# Patient Record
Sex: Female | Born: 1955
Health system: Southern US, Community
[De-identification: ages and names within clinical notes are randomized; demographics above are authoritative.]

## PROBLEM LIST (undated history)

## (undated) DIAGNOSIS — J330 Polyp of nasal cavity: Secondary | ICD-10-CM

## (undated) DIAGNOSIS — IMO0002 Reserved for concepts with insufficient information to code with codable children: Secondary | ICD-10-CM

## (undated) DIAGNOSIS — A64 Unspecified sexually transmitted disease: Secondary | ICD-10-CM

## (undated) DIAGNOSIS — E785 Hyperlipidemia, unspecified: Secondary | ICD-10-CM

## (undated) DIAGNOSIS — I10 Essential (primary) hypertension: Secondary | ICD-10-CM

## (undated) DIAGNOSIS — J309 Allergic rhinitis, unspecified: Secondary | ICD-10-CM

## (undated) DIAGNOSIS — D649 Anemia, unspecified: Secondary | ICD-10-CM

## (undated) DIAGNOSIS — J45909 Unspecified asthma, uncomplicated: Secondary | ICD-10-CM

## (undated) DIAGNOSIS — E119 Type 2 diabetes mellitus without complications: Secondary | ICD-10-CM

## (undated) DIAGNOSIS — T7840XA Allergy, unspecified, initial encounter: Secondary | ICD-10-CM

## (undated) DIAGNOSIS — K219 Gastro-esophageal reflux disease without esophagitis: Secondary | ICD-10-CM

## (undated) DIAGNOSIS — D219 Benign neoplasm of connective and other soft tissue, unspecified: Secondary | ICD-10-CM

## (undated) DIAGNOSIS — C439 Malignant melanoma of skin, unspecified: Secondary | ICD-10-CM

## (undated) DIAGNOSIS — F419 Anxiety disorder, unspecified: Secondary | ICD-10-CM

## (undated) HISTORY — DX: Anemia, unspecified: D64.9

## (undated) HISTORY — DX: Essential (primary) hypertension: I10

## (undated) HISTORY — DX: Type 2 diabetes mellitus without complications: E11.9

## (undated) HISTORY — DX: Allergic rhinitis, unspecified: J30.9

## (undated) HISTORY — DX: Allergy, unspecified, initial encounter: T78.40XA

## (undated) HISTORY — DX: Benign neoplasm of connective and other soft tissue, unspecified: D21.9

## (undated) HISTORY — PX: LAPAROSCOPIC OVARIAN CYSTECTOMY: SUR786

## (undated) HISTORY — PX: UPPER GASTROINTESTINAL ENDOSCOPY: SHX188

## (undated) HISTORY — DX: Unspecified sexually transmitted disease: A64

## (undated) HISTORY — PX: ROTATOR CUFF REPAIR: SHX139

## (undated) HISTORY — PX: MELANOMA EXCISION WITH SENTINEL LYMPH NODE BIOPSY: SHX5267

## (undated) HISTORY — PX: COLONOSCOPY: SHX174

## (undated) HISTORY — DX: Malignant melanoma of skin, unspecified: C43.9

## (undated) HISTORY — PX: TONSILLECTOMY: SUR1361

## (undated) HISTORY — DX: Reserved for concepts with insufficient information to code with codable children: IMO0002

## (undated) HISTORY — DX: Unspecified asthma, uncomplicated: J45.909

## (undated) HISTORY — DX: Polyp of nasal cavity: J33.0

## (undated) HISTORY — DX: Gastro-esophageal reflux disease without esophagitis: K21.9

## (undated) HISTORY — DX: Anxiety disorder, unspecified: F41.9

## (undated) HISTORY — DX: Hyperlipidemia, unspecified: E78.5

---

## 1988-02-03 DIAGNOSIS — IMO0002 Reserved for concepts with insufficient information to code with codable children: Secondary | ICD-10-CM

## 1988-02-03 DIAGNOSIS — R87619 Unspecified abnormal cytological findings in specimens from cervix uteri: Secondary | ICD-10-CM

## 1988-02-03 HISTORY — DX: Unspecified abnormal cytological findings in specimens from cervix uteri: R87.619

## 1988-02-03 HISTORY — PX: CRYOTHERAPY: SHX1416

## 1988-02-03 HISTORY — DX: Reserved for concepts with insufficient information to code with codable children: IMO0002

## 1998-11-05 ENCOUNTER — Encounter: Payer: Self-pay | Admitting: *Deleted

## 1998-11-05 ENCOUNTER — Ambulatory Visit (HOSPITAL_COMMUNITY): Admission: RE | Admit: 1998-11-05 | Discharge: 1998-11-05 | Payer: Self-pay | Admitting: *Deleted

## 1999-03-31 ENCOUNTER — Other Ambulatory Visit: Admission: RE | Admit: 1999-03-31 | Discharge: 1999-03-31 | Payer: Self-pay | Admitting: *Deleted

## 2000-02-03 HISTORY — PX: SUPRACERVICAL ABDOMINAL HYSTERECTOMY: SHX5393

## 2000-03-29 ENCOUNTER — Other Ambulatory Visit: Admission: RE | Admit: 2000-03-29 | Discharge: 2000-03-29 | Payer: Self-pay | Admitting: *Deleted

## 2001-01-13 ENCOUNTER — Encounter (INDEPENDENT_AMBULATORY_CARE_PROVIDER_SITE_OTHER): Payer: Self-pay | Admitting: Specialist

## 2001-01-13 ENCOUNTER — Observation Stay (HOSPITAL_COMMUNITY): Admission: RE | Admit: 2001-01-13 | Discharge: 2001-01-14 | Payer: Self-pay | Admitting: *Deleted

## 2001-04-28 ENCOUNTER — Encounter: Payer: Self-pay | Admitting: *Deleted

## 2001-04-28 ENCOUNTER — Ambulatory Visit (HOSPITAL_COMMUNITY): Admission: RE | Admit: 2001-04-28 | Discharge: 2001-04-28 | Payer: Self-pay | Admitting: *Deleted

## 2001-05-25 ENCOUNTER — Encounter: Admission: RE | Admit: 2001-05-25 | Discharge: 2001-05-25 | Payer: Self-pay | Admitting: Internal Medicine

## 2001-05-25 ENCOUNTER — Encounter: Payer: Self-pay | Admitting: Internal Medicine

## 2001-06-28 ENCOUNTER — Other Ambulatory Visit: Admission: RE | Admit: 2001-06-28 | Discharge: 2001-06-28 | Payer: Self-pay | Admitting: *Deleted

## 2002-04-19 ENCOUNTER — Emergency Department (HOSPITAL_COMMUNITY): Admission: EM | Admit: 2002-04-19 | Discharge: 2002-04-19 | Payer: Self-pay

## 2002-07-11 ENCOUNTER — Other Ambulatory Visit: Admission: RE | Admit: 2002-07-11 | Discharge: 2002-07-11 | Payer: Self-pay | Admitting: *Deleted

## 2002-12-01 ENCOUNTER — Encounter (INDEPENDENT_AMBULATORY_CARE_PROVIDER_SITE_OTHER): Payer: Self-pay | Admitting: Specialist

## 2002-12-01 ENCOUNTER — Ambulatory Visit (HOSPITAL_COMMUNITY): Admission: RE | Admit: 2002-12-01 | Discharge: 2002-12-01 | Payer: Self-pay | Admitting: *Deleted

## 2003-01-31 ENCOUNTER — Emergency Department (HOSPITAL_COMMUNITY): Admission: EM | Admit: 2003-01-31 | Discharge: 2003-01-31 | Payer: Self-pay | Admitting: Emergency Medicine

## 2003-02-03 ENCOUNTER — Emergency Department (HOSPITAL_COMMUNITY): Admission: EM | Admit: 2003-02-03 | Discharge: 2003-02-03 | Payer: Self-pay | Admitting: Emergency Medicine

## 2003-02-05 ENCOUNTER — Ambulatory Visit (HOSPITAL_COMMUNITY): Admission: RE | Admit: 2003-02-05 | Discharge: 2003-02-05 | Payer: Self-pay | Admitting: *Deleted

## 2003-07-31 ENCOUNTER — Other Ambulatory Visit: Admission: RE | Admit: 2003-07-31 | Discharge: 2003-07-31 | Payer: Self-pay | Admitting: *Deleted

## 2003-09-26 ENCOUNTER — Ambulatory Visit (HOSPITAL_COMMUNITY): Admission: RE | Admit: 2003-09-26 | Discharge: 2003-09-26 | Payer: Self-pay | Admitting: Internal Medicine

## 2003-12-15 ENCOUNTER — Encounter: Admission: RE | Admit: 2003-12-15 | Discharge: 2003-12-15 | Payer: Self-pay | Admitting: Internal Medicine

## 2004-02-03 HISTORY — PX: ROTATOR CUFF REPAIR: SHX139

## 2004-04-17 ENCOUNTER — Ambulatory Visit: Payer: Self-pay | Admitting: Internal Medicine

## 2004-04-29 ENCOUNTER — Ambulatory Visit (HOSPITAL_COMMUNITY): Admission: RE | Admit: 2004-04-29 | Discharge: 2004-04-29 | Payer: Self-pay | Admitting: *Deleted

## 2004-08-14 ENCOUNTER — Emergency Department (HOSPITAL_COMMUNITY): Admission: EM | Admit: 2004-08-14 | Discharge: 2004-08-14 | Payer: Self-pay

## 2004-09-04 ENCOUNTER — Other Ambulatory Visit: Admission: RE | Admit: 2004-09-04 | Discharge: 2004-09-04 | Payer: Self-pay | Admitting: *Deleted

## 2004-11-14 ENCOUNTER — Ambulatory Visit: Payer: Self-pay | Admitting: Internal Medicine

## 2005-04-30 ENCOUNTER — Ambulatory Visit: Payer: Self-pay | Admitting: Internal Medicine

## 2005-05-29 ENCOUNTER — Ambulatory Visit (HOSPITAL_COMMUNITY): Admission: RE | Admit: 2005-05-29 | Discharge: 2005-05-29 | Payer: Self-pay | Admitting: *Deleted

## 2005-08-11 ENCOUNTER — Ambulatory Visit (HOSPITAL_BASED_OUTPATIENT_CLINIC_OR_DEPARTMENT_OTHER): Admission: RE | Admit: 2005-08-11 | Discharge: 2005-08-11 | Payer: Self-pay | Admitting: Orthopaedic Surgery

## 2005-08-21 ENCOUNTER — Encounter: Admission: RE | Admit: 2005-08-21 | Discharge: 2005-08-21 | Payer: Self-pay | Admitting: Internal Medicine

## 2005-09-22 ENCOUNTER — Ambulatory Visit (HOSPITAL_COMMUNITY): Admission: RE | Admit: 2005-09-22 | Discharge: 2005-09-22 | Payer: Self-pay | Admitting: *Deleted

## 2005-10-15 ENCOUNTER — Other Ambulatory Visit: Admission: RE | Admit: 2005-10-15 | Discharge: 2005-10-15 | Payer: Self-pay | Admitting: *Deleted

## 2006-04-30 ENCOUNTER — Ambulatory Visit: Payer: Self-pay | Admitting: Internal Medicine

## 2006-11-11 ENCOUNTER — Ambulatory Visit: Payer: Self-pay | Admitting: Internal Medicine

## 2006-11-30 ENCOUNTER — Other Ambulatory Visit: Admission: RE | Admit: 2006-11-30 | Discharge: 2006-11-30 | Payer: Self-pay | Admitting: *Deleted

## 2007-04-27 DIAGNOSIS — J4520 Mild intermittent asthma, uncomplicated: Secondary | ICD-10-CM | POA: Insufficient documentation

## 2007-04-27 DIAGNOSIS — J3089 Other allergic rhinitis: Secondary | ICD-10-CM | POA: Insufficient documentation

## 2007-04-27 DIAGNOSIS — J302 Other seasonal allergic rhinitis: Secondary | ICD-10-CM | POA: Insufficient documentation

## 2007-04-27 DIAGNOSIS — J330 Polyp of nasal cavity: Secondary | ICD-10-CM | POA: Insufficient documentation

## 2007-04-28 ENCOUNTER — Ambulatory Visit: Payer: Self-pay | Admitting: Internal Medicine

## 2007-06-03 ENCOUNTER — Ambulatory Visit: Payer: Self-pay | Admitting: Internal Medicine

## 2007-11-15 ENCOUNTER — Other Ambulatory Visit: Payer: Self-pay | Admitting: Emergency Medicine

## 2007-11-15 ENCOUNTER — Inpatient Hospital Stay (HOSPITAL_COMMUNITY): Admission: AD | Admit: 2007-11-15 | Discharge: 2007-11-21 | Payer: Self-pay | Admitting: Internal Medicine

## 2007-12-06 ENCOUNTER — Other Ambulatory Visit: Admission: RE | Admit: 2007-12-06 | Discharge: 2007-12-06 | Payer: Self-pay | Admitting: Obstetrics & Gynecology

## 2008-01-04 ENCOUNTER — Ambulatory Visit (HOSPITAL_COMMUNITY): Admission: RE | Admit: 2008-01-04 | Discharge: 2008-01-04 | Payer: Self-pay | Admitting: Obstetrics and Gynecology

## 2008-04-27 ENCOUNTER — Ambulatory Visit: Payer: Self-pay | Admitting: Internal Medicine

## 2008-04-27 DIAGNOSIS — E119 Type 2 diabetes mellitus without complications: Secondary | ICD-10-CM | POA: Insufficient documentation

## 2009-04-23 ENCOUNTER — Ambulatory Visit (HOSPITAL_COMMUNITY): Admission: RE | Admit: 2009-04-23 | Discharge: 2009-04-23 | Payer: Self-pay | Admitting: Obstetrics and Gynecology

## 2009-04-26 ENCOUNTER — Ambulatory Visit: Payer: Self-pay | Admitting: Internal Medicine

## 2010-03-04 NOTE — Assessment & Plan Note (Signed)
Summary: 12 MTH FU-STC   Visit Type:  Follow-up  Chief Complaint:  12 month follow-up visit.  History of Present Illness: Current Problems:  Hx of NASAL POLYP (ICD-471.0) ALLERGIC RHINITIS (ICD-477.9) ASTHMA (ICD-493.90)  One year f/u. Has done fairly well. Lacrimation last year resolved. Albuterol makes her too tachy and is avoided. She has only needed her replacement atrovent rescue inhaler twice. We reviewed her meds.       Current Allergies: ! PENICILLIN ! CIPRO ! ZITHROMAX   Family History:    Reviewed history and no changes required:       Sister Rogue Bussing with severe chronic asthma  Social History:    Reviewed history and no changes required:       Patient never smoked.    Risk Factors:  Tobacco use:  never   Review of Systems      See HPI   Vital Signs:  Patient Profile:   55 Years Old Female Weight:      162.38 pounds O2 Sat:      100 % O2 treatment:    Room Air Pulse rate:   79 / minute BP sitting:   98 / 70  (left arm)  Vitals Entered By: Cloyde Reams RN (April 28, 2007 2:33 PM)             Comments Pt here for 12 month follow-up visit.  Breathing well overall. C/O normal allergy symptoms for this time of year with eyes. Medications reviewed ..................................................................Marland KitchenCloyde Reams RN  April 28, 2007 2:39 PM      Physical Exam  General:     normal appearance and healthy appearing.   Head:     normocephalic and atraumatic Eyes:     PERRLA/EOM intact; conjunctiva and sclera clear. contacts Ears:     TMs intact and clear with normal canals Nose:     no deformity, discharge, inflammation, or lesions Mouth:     no deformity or lesions Neck:     no JVD.   Lungs:     clear bilaterally to auscultation and percussion Heart:     regular rate and rhythm, S1, S2 without murmurs, rubs, gallops, or clicks     Problem # 1:  ALLERGIC RHINITIS (ICD-477.9) Assessment: Improved  Her updated  medication list for this problem includes:    Flonase 50 Mcg/act Susp (Fluticasone propionate) .Marland Kitchen... Take 2 sprays in each nostril once a day    Allegra 180 Mg Tabs (Fexofenadine hcl) .Marland Kitchen... Take 1 tablet by mouth once a day  Orders: Est. Patient Level III (16109)   Problem # 2:  ASTHMA (ICD-493.90) Assessment: Improved  The following medications were removed from the medication list:    Atrovent Hfa 17 Mcg/act Aers (Ipratropium bromide hfa) ..... Inhale 2 puffs every 4 hours as needed  Her updated medication list for this problem includes:    Singulair 10 Mg Tabs (Montelukast sodium) .Marland Kitchen... Take 1 tablet by mouth once a day    Atrovent Hfa 17 Mcg/act Aers (Ipratropium bromide hfa) ..... Inhale 2 puffs every 4 hours as needed  Orders: Est. Patient Level III (60454)   Medications Added to Medication List This Visit: 1)  Flonase 50 Mcg/act Susp (Fluticasone propionate) .... Take 2 sprays in each nostril once a day 2)  Amerge 2.5 Mg Tabs (Naratriptan hcl) .... As needed 3)  Atrovent Hfa 17 Mcg/act Aers (Ipratropium bromide hfa) .... Inhale 2 puffs every 4 hours as needed 4)  Patanol 0.1 % Soln (Olopatadine hcl) .Marland KitchenMarland KitchenMarland Kitchen  As needed 5)  Fosamax 70 Mg Tabs (Alendronate sodium) .... Take 1 tablet once weekly 6)  Calcium For Women 500-100-40 Chew (Calcium-vitamin d-vitamin k) .... Take 1 chew by mouth two times a day   Patient Instructions: 1)  Please schedule a follow-up appointment in 1 year. 2)  Meds refilled. Call for atrovent refill as needed.    Prescriptions: ALLEGRA 180 MG  TABS (FEXOFENADINE HCL) Take 1 tablet by mouth once a day  #90 x 3   Entered and Authorized by:   Waymon Budge MD   Signed by:   Waymon Budge MD on 04/28/2007   Method used:   Print then Give to Patient   RxID:   [id number] FLONASE 50 MCG/ACT  SUSP (FLUTICASONE PROPIONATE) Take 2 sprays in each nostril once a day  #3 x 3   Entered and Authorized by:   Waymon Budge MD   Signed by:   Waymon Budge MD on 04/28/2007   Method used:   Print then Give to Patient   RxID:   [id number] SINGULAIR 10 MG  TABS (MONTELUKAST SODIUM) Take 1 tablet by mouth once a day  #90 x 3   Entered and Authorized by:   Waymon Budge MD   Signed by:   Waymon Budge MD on 04/28/2007   Method used:   Print then Give to Patient   RxID:   [id number]  ]

## 2010-03-04 NOTE — Assessment & Plan Note (Signed)
Summary: 1 year / mbw   Primary Provider/Referring Provider:  Merri Brunette  CC:  Yearly Follow up visit.  History of Present Illness:  06/03/07- Allergic rhinitis, asthma, hx nasal polyps. She reports that she has noticed wheezing, if she is quiet, especially at night over the past week, and worse in the past two days.  She woke feeling hoarse this morning.  Otherwise, she has had no head congestion.  She does not feel acutely ill, and she denies purulent discharge, fever, headache,.  04/27/08- Allergic rhinitis, asthma, hx nasal polyps Hosp this winter and dx'd as diabetic. She is noting minimal allergy/ rhinitis so far this Spring. She feels these are well controlled with current meds. We discussed needs and pollen precautions. Denies wheeze, cough or phlegm.  April 26, 2009- Allergic rhinitis, ashtma, hx nasal polyps Uneventful year. After working outdoors last weekend she has had more nasal drainage. Coughs up some dark yellow. Taking allegra and flonase. Singulair has gotten too expensive.  No fever or wheeze.  Current Medications (verified): 1)  Singulair 10 Mg  Tabs (Montelukast Sodium) .... Take 1 Tablet By Mouth Once A Day 2)  Flonase 50 Mcg/act  Susp (Fluticasone Propionate) .... Take 2 Sprays in Each Nostril Once A Day 3)  Omeprazole 20 Mg Cpdr (Omeprazole) .... Take 1 By Mouth Once Daily 4)  Allegra 180 Mg  Tabs (Fexofenadine Hcl) .... Take 1 Tablet By Mouth Once A Day 5)  Multivitamins   Tabs (Multiple Vitamin) .... Take 1 Tablet By Mouth Once A Day 6)  Estroven   Tabs (Nutritional Supplements) .... Take 1 Tablet By Mouth Once A Day 7)  Atrovent Hfa 17 Mcg/act  Aers (Ipratropium Bromide Hfa) .... Inhale 2 Puffs Every 4 Hours As Needed 8)  Calcium Carbonate-Vitamin D 600-400 Mg-Unit  Tabs (Calcium Carbonate-Vitamin D) .... Take 1 Tablet By Mouth Once A Day 9)  Gnp Magnesium 250 Mg  Tabs (Magnesium) .... Take 1 Tablet By Mouth Once A Day 10)  Patanol 0.1 %  Soln (Olopatadine  Hcl) .... As Needed 11)  Fosamax 70 Mg  Tabs (Alendronate Sodium) .... Take 1 Tablet Once Weekly 12)  Calcium For Women 500-100-40  Chew (Calcium-Vitamin D-Vitamin K) .... Take 1 Chew By Mouth Two Times A Day 13)  Lisinopril 10 Mg Tabs (Lisinopril) .... Take 1 By Mouth Once Daily 14)  Metformin Hcl 1000 Mg Tabs (Metformin Hcl) .... Take 1 By Mouth Two Times A Day 15)  Glimepiride 4 Mg Tabs (Glimepiride) .... Take 1 By Mouth Two Times A Day 16)  Imitrex 20 Mg/act Soln (Sumatriptan) .... As Needed As Directed 17)  Simvastatin 20 Mg Tabs (Simvastatin) .... Take 1 By Mouth Once Daily 18)  Lantus 100 Unit/ml Soln (Insulin Glargine) .Marland Kitchen.. 14 Units Once Daily  Allergies (verified): 1)  ! Penicillin 2)  ! Cipro 3)  ! Zithromax  Past History:  Past Medical History: Last updated: 06/03/2007 Hx of NASAL POLYP (ICD-471.0) ALLERGIC RHINITIS (ICD-477.9) ASTHMA (ICD-493.90)  Past Surgical History: Last updated: 04/27/2008 Tonsillectomy Total Abdominal Hysterectomy Ovarian cystectomy Rotator cuff  Family History: Last updated: 04/28/2007 Sister Rogue Bussing with severe chronic asthma  Social History: Last updated: 06/03/2007 Patient never smoked. works at [employer]   Risk Factors: Smoking Status: never (04/28/2007)  Review of Systems      See HPI  The patient denies anorexia, fever, weight loss, weight gain, vision loss, decreased hearing, hoarseness, chest pain, syncope, dyspnea on exertion, peripheral edema, prolonged cough, headaches, hemoptysis, and severe  indigestion/heartburn.    Vital Signs:  Patient profile:   55 year old female Height:      63 inches Weight:      152.13 pounds BMI:     27.05 O2 Sat:      99 % on Room air Pulse rate:   108 / minute BP sitting:   104 / 74  (left arm) Cuff size:   regular  Vitals Entered By: Reynaldo Minium CMA (April 26, 2009 2:12 PM)  O2 Flow:  Room air  Physical Exam  Additional Exam:  General: A/Ox3; pleasant and  cooperative, NAD, wdwn SKIN: no rash, lesions NODES: no lymphadenopathy HEENT: [place]/AT, EOM- WNL, Conjuctivae- clear, PERRLA, TM-WNL, Nose- clear, Throat- clear and wnl, Mallampati  III NECK: Supple w/ fair ROM, JVD- none, normal carotid impulses w/o bruits Thyroid- normal to palpation CHEST: Clear to P&A HEART: RRR, no m/g/r heard ABDOMEN: Soft and nl; nml bowel sounds; no organomegaly or masses noted Extremities- No cyanosis, clubbing or edema Neuro- grossly WNL to observation      Impression & Recommendations:  Problem # 1:  ALLERGIC RHINITIS (ICD-477.9)  Seasonal exacerbation. I don't think she needs an antibiotic now but we will watch this. Consider Neti Pot. Her updated medication list for this problem includes:    Flonase 50 Mcg/act Susp (Fluticasone propionate) .Marland Kitchen... Take 2 sprays in each nostril once a day    Allegra 180 Mg Tabs (Fexofenadine hcl) .Marland Kitchen... Take 1 tablet by mouth once a day    Astepro 0.15 % Soln (Azelastine hcl) .Marland Kitchen... 1-2 sprays eaqh nostril up to twice daily as needed  Problem # 2:  ASTHMA (ICD-493.90) She uses Atrovent rescue inhaler only occasionally.  Medications Added to Medication List This Visit: 1)  Omeprazole 20 Mg Cpdr (Omeprazole) .... Take 1 by mouth once daily 2)  Metformin Hcl 1000 Mg Tabs (Metformin hcl) .... Take 1 by mouth two times a day 3)  Glimepiride 4 Mg Tabs (Glimepiride) .... Take 1 by mouth two times a day 4)  Imitrex 20 Mg/act Soln (Sumatriptan) .... As needed as directed 5)  Simvastatin 20 Mg Tabs (Simvastatin) .... Take 1 by mouth once daily 6)  Lantus 100 Unit/ml Soln (Insulin glargine) .Marland Kitchen.. 14 units once daily 7)  Astepro 0.15 % Soln (Azelastine hcl) .Marland Kitchen.. 1-2 sprays eaqh nostril up to twice daily as needed  Other Orders: Est. Patient Level III (16109)  Patient Instructions: 1)  Please schedule a follow-up appointment in 1 year. 2)  Refills for flonase/ fluticasone and for atrovent inhaler 3)  Sample / script Astepro nasal  antihistamine spray: 1-2 puffs each nostril up to twice daily as needed. Prescriptions: ASTEPRO 0.15 % SOLN (AZELASTINE HCL) 1-2 sprays eaqh nostril up to twice daily as needed  #1 x prn   Entered and Authorized by:   Waymon Budge MD   Signed by:   Waymon Budge MD on 04/26/2009   Method used:   Print then Give to Patient   RxID:   [id number] ATROVENT HFA 17 MCG/ACT  AERS (IPRATROPIUM BROMIDE HFA) Inhale 2 puffs every 4 hours as needed  #1 x prn   Entered and Authorized by:   Waymon Budge MD   Signed by:   Waymon Budge MD on 04/26/2009   Method used:   Print then Give to Patient   RxID:   [id number] FLONASE 50 MCG/ACT  SUSP (FLUTICASONE PROPIONATE) Take 2 sprays in each nostril once a day  #3 x 3  Entered and Authorized by:   Waymon Budge MD   Signed by:   Waymon Budge MD on 04/26/2009   Method used:   Print then Give to Patient   RxID:   [id number]

## 2010-03-04 NOTE — Assessment & Plan Note (Signed)
Summary: sob///kp    Visit Type:  Follow-up  Chief Complaint:  Acute OV.  History of Present Illness: Current Problems:  Hx of NASAL POLYP (ICD-471.0) ALLERGIC RHINITIS (ICD-477.9) ASTHMA (ICD-493.90)  She reports that she has noticed wheezing, if she is quiet, especially at night over the past week, and worse in the past two days.  She woke feeling hoarse this morning.  Otherwise, she has had no head congestion.  She does not feel acutely ill, and she denies purulent discharge, fever, headache,.       Current Allergies: ! PENICILLIN ! CIPRO ! ZITHROMAX  Past Medical History:    Reviewed history and no changes required:       Hx of NASAL POLYP (ICD-471.0)       ALLERGIC RHINITIS (ICD-477.9)       ASTHMA (ICD-493.90)                        Family History:    Reviewed history from 04/28/2007 and no changes required:       Sister Rogue Bussing with severe chronic asthma  Social History:    Reviewed history from 04/28/2007 and no changes required:       Patient never smoked.       works at [employer]     Review of Systems      See HPI   Vital Signs:  Patient Profile:   55 Years Old Female Weight:      158 pounds O2 Sat:      100 % O2 treatment:    Room Air Pulse rate:   111 / minute BP sitting:   110 / 70  (left arm)  Vitals Entered By: Cloyde Reams RN (Jun 03, 2007 4:15 PM)             Comments Pt is here today for incr SOB. Onset x 1 1/2 weeks ago, gradually worsening. Medications reviewed Cloyde Reams RN  Jun 03, 2007 4:26 PM      Physical Exam  General:     well developed, well nourished, in no acute distress Eyes:     PERRLA/EOM intact; conjunctiva and sclera clear Ears:     TMs intact and clear with normal canals Nose:     no deformity, discharge, inflammation, or lesions Mouth:     no deformity or lesions Neck:     no JVD.   Lungs:     there is mild wheeze with inspiration, bilaterally. Heart:     regular rate and rhythm,  S1, S2 without murmurs, rubs, gallops, or clicks Cervical Nodes:     no significant adenopathy Axillary Nodes:     no significant adenopathy     Problem # 1:  ASTHMA (ICD-493.90) Assessment: Deteriorated mild seasonal exacerbation of asthma.  I don't think she has a cold.  This probably seasonal pollen reaction.  We discussed treatment options and decided to try to control her symptoms for another week or two, hoping the pollen, levels would fall and symptoms would resolve. Her updated medication list for this problem includes:    Singulair 10 Mg Tabs (Montelukast sodium) .Marland Kitchen... Take 1 tablet by mouth once a day    Atrovent Hfa 17 Mcg/act Aers (Ipratropium bromide hfa) ..... Inhale 2 puffs every 4 hours as needed  Orders: Est. Patient Level III (23536)   Problem # 2:  ALLERGIC RHINITIS (ICD-477.9) Assessment: Unchanged  Her updated medication list for this problem includes:  Flonase 50 Mcg/act Susp (Fluticasone propionate) .Marland Kitchen... Take 2 sprays in each nostril once a day    Allegra 180 Mg Tabs (Fexofenadine hcl) .Marland Kitchen... Take 1 tablet by mouth once a day  Orders: Est. Patient Level III (16109)    Patient Instructions: 1)  Keep appt 2)  Neb xop 0.63 3)  depo 80    ]

## 2010-03-04 NOTE — Assessment & Plan Note (Signed)
Summary: FOLLOW UP/ MBW   Primary Provider/Referring Provider:  Merri Brunette  CC:  follow up visit-allergies and asthma.  History of Present Illness: Current Problems:  Hx of NASAL POLYP (ICD-471.0) ALLERGIC RHINITIS (ICD-477.9) ASTHMA (ICD-493.90)  06/03/07- Allergic rhinitis, asthma, hx nasal polyps. She reports that she has noticed wheezing, if she is quiet, especially at night over the past week, and worse in the past two days.  She woke feeling hoarse this morning.  Otherwise, she has had no head congestion.  She does not feel acutely ill, and she denies purulent discharge, fever, headache,.  04/27/08- Allergic rhinitis, asthma, hx nasal polyps Hosp this winter and dx'd as diabetic. She is noting minimal allergy/ rhinitis so far this Spring. She feels these are well controlled with current meds. We discussed needs and pollen precautions. Denies wheeze, cough or phlegm.    Problems Prior to Update: 1)  Hx of Nasal Polyp  (ICD-471.0) 2)  Allergic Rhinitis  (ICD-477.9) 3)  Asthma  (ICD-493.90) 4)  Aodm  (ICD-250.00)  Current Medications (verified): 1)  Singulair 10 Mg  Tabs (Montelukast Sodium) .... Take 1 Tablet By Mouth Once A Day 2)  Flonase 50 Mcg/act  Susp (Fluticasone Propionate) .... Take 2 Sprays in Each Nostril Once A Day 3)  Topamax 100 Mg  Tabs (Topiramate) .... Take 1 Tablet By Mouth Once A Day 4)  Nexium 40 Mg  Pack (Esomeprazole Magnesium) .... Take 1 Capsule By Mouth Once A Day 5)  Amerge 2.5 Mg  Tabs (Naratriptan Hcl) .... As Needed 6)  Allegra 180 Mg  Tabs (Fexofenadine Hcl) .... Take 1 Tablet By Mouth Once A Day 7)  Multivitamins   Tabs (Multiple Vitamin) .... Take 1 Tablet By Mouth Once A Day 8)  Estroven   Tabs (Nutritional Supplements) .... Take 1 Tablet By Mouth Once A Day 9)  Atrovent Hfa 17 Mcg/act  Aers (Ipratropium Bromide Hfa) .... Inhale 2 Puffs Every 4 Hours As Needed 10)  Calcium Carbonate-Vitamin D 600-400 Mg-Unit  Tabs (Calcium Carbonate-Vitamin D)  .... Take 1 Tablet By Mouth Once A Day 11)  Gnp Magnesium 250 Mg  Tabs (Magnesium) .... Take 1 Tablet By Mouth Once A Day 12)  Patanol 0.1 %  Soln (Olopatadine Hcl) .... As Needed 13)  Fosamax 70 Mg  Tabs (Alendronate Sodium) .... Take 1 Tablet Once Weekly 14)  Calcium For Women 500-100-40  Chew (Calcium-Vitamin D-Vitamin K) .... Take 1 Chew By Mouth Two Times A Day 15)  Janumet 50-1000 Mg Tabs (Sitagliptin-Metformin Hcl) .... Take 1 By Mouth Two Times A Day 16)  Lisinopril 10 Mg Tabs (Lisinopril) .... Take 1 By Mouth Once Daily  Allergies (verified): 1)  ! Penicillin 2)  ! Cipro 3)  ! Zithromax  Past History:  Family History:    Sister Rogue Bussing with severe chronic asthma (04/28/2007)  Social History:    Patient never smoked.    works at [employer]      (06/03/2007)  Risk Factors:    Alcohol Use: N/A    >5 drinks/d w/in last 3 months: N/A    Caffeine Use: N/A    Diet: N/A    Exercise: N/A  Risk Factors:    Smoking Status: never (04/28/2007)    Packs/Day: N/A    Cigars/wk: N/A    Pipe Use/wk: N/A    Cans of tobacco/wk: N/A    Passive Smoke Exposure: N/A  Past medical, surgical, family and social histories (including risk factors) reviewed for relevance to current  acute and chronic problems.  Past Medical History:    Reviewed history from 06/03/2007 and no changes required:    Hx of NASAL POLYP (ICD-471.0)    ALLERGIC RHINITIS (ICD-477.9)    ASTHMA (ICD-493.90)  Past Surgical History:    Tonsillectomy    Total Abdominal Hysterectomy    Ovarian cystectomy    Rotator cuff  Family History:    Reviewed history from 04/28/2007 and no changes required:       Sister Rogue Bussing with severe chronic asthma  Social History:    Reviewed history from 06/03/2007 and no changes required:       Patient never smoked.       works at [employer]      See HPI  The patient denies fever, chest pain, hemoptysis, abdominal pain, melena, and  severe indigestion/heartburn.         Denies headache, sinus drainage, sneezing chest pain, dyspnea, n/v/d, weight loss, fever, edema.    Vital Signs:  Patient profile:   55 year old female Height:      63 inches Weight:      156.50 pounds BMI:     27.82 Pulse rate:   105 / minute BP sitting:   114 / 58  (left arm) Cuff size:   regular  Vitals Entered By: Reynaldo Minium CMA (April 27, 2008 2:37 PM)  O2 Sat on room air at rest %:  100 CC: follow up visit-allergies and asthma Comments Medications reviewed with patient Reynaldo Minium CMA  April 27, 2008 2:37 PM    Physical Exam  Additional Exam:  General: A/Ox3; pleasant and cooperative, NAD, wdwn SKIN: no rash, lesions NODES: no lymphadenopathy HEENT: [place]/AT, EOM- WNL, Conjuctivae- clear, PERRLA, TM-WNL, Nose- clear, Throat- clear and wnl, Mrelampattii II NECK: Supple w/ fair ROM, JVD- none, normal carotid impulses w/o bruits Thyroid- normal to palpation CHEST: Clear to P&A HEART: RRR, no m/g/r heard ABDOMEN: Soft and nl; nml bowel sounds; no organomegaly or masses noted      Pulmonary Function Test Height (in.): 63 Gender: Female  Impression & Recommendations:  Problem # 1:  ALLERGIC RHINITIS (ICD-477.9)  Still well controlled but she is aware of the seasonal changes beginning. We are refilling her meds with discussion. Her updated medication list for this problem includes:    Flonase 50 Mcg/act Susp (Fluticasone propionate) .Marland Kitchen... Take 2 sprays in each nostril once a day    Allegra 180 Mg Tabs (Fexofenadine hcl) .Marland Kitchen... Take 1 tablet by mouth once a day  Problem # 2:  ASTHMA (ICD-493.90) Assessment: Improved Good control.  Complete Medication List: 1)  Singulair 10 Mg Tabs (Montelukast sodium) .... Take 1 tablet by mouth once a day 2)  Flonase 50 Mcg/act Susp (Fluticasone propionate) .... Take 2 sprays in each nostril once a day 3)  Topamax 100 Mg Tabs (Topiramate) .... Take 1 tablet by mouth once a day 4)  Nexium  40 Mg Pack (Esomeprazole magnesium) .... Take 1 capsule by mouth once a day 5)  Amerge 2.5 Mg Tabs (Naratriptan hcl) .... As needed 6)  Allegra 180 Mg Tabs (Fexofenadine hcl) .... Take 1 tablet by mouth once a day 7)  Multivitamins Tabs (Multiple vitamin) .... Take 1 tablet by mouth once a day 8)  Estroven Tabs (Nutritional supplements) .... Take 1 tablet by mouth once a day 9)  Atrovent Hfa 17 Mcg/act Aers (Ipratropium bromide hfa) .... Inhale 2 puffs every 4 hours as needed 10)  Calcium Carbonate-vitamin D 600-400 Mg-unit Tabs (Calcium carbonate-vitamin d) .... Take 1 tablet by mouth once a day 11)  Gnp Magnesium 250 Mg Tabs (Magnesium) .... Take 1 tablet by mouth once a day 12)  Patanol 0.1 % Soln (Olopatadine hcl) .... As needed 13)  Fosamax 70 Mg Tabs (Alendronate sodium) .... Take 1 tablet once weekly 14)  Calcium For Women 500-100-40 Chew (Calcium-vitamin d-vitamin k) .... Take 1 chew by mouth two times a day 15)  Janumet 50-1000 Mg Tabs (Sitagliptin-metformin hcl) .... Take 1 by mouth two times a day 16)  Lisinopril 10 Mg Tabs (Lisinopril) .... Take 1 by mouth once daily  Other Orders: Est. Patient Level II (08657)  Patient Instructions: 1)  Please schedule a follow-up appointment in 1 year. 2)  meds refilled Prescriptions: ATROVENT HFA 17 MCG/ACT  AERS (IPRATROPIUM BROMIDE HFA) Inhale 2 puffs every 4 hours as needed  #1 x prn   Entered and Authorized by:   Waymon Budge MD   Signed by:   Waymon Budge MD on 04/27/2008   Method used:   Print then Give to Patient   RxID:   [id number] ALLEGRA 180 MG  TABS (FEXOFENADINE HCL) Take 1 tablet by mouth once a day  #90 x 3   Entered and Authorized by:   Waymon Budge MD   Signed by:   Waymon Budge MD on 04/27/2008   Method used:   Print then Give to Patient   RxID:   [id number] FLONASE 50 MCG/ACT  SUSP (FLUTICASONE PROPIONATE) Take 2 sprays in each nostril once a day  #3 x 3   Entered and Authorized by:   Waymon Budge MD   Signed by:   Waymon Budge MD on 04/27/2008   Method used:   Print then Give to Patient   RxID:   [id number] SINGULAIR 10 MG  TABS (MONTELUKAST SODIUM) Take 1 tablet by mouth once a day  #90 x 3   Entered and Authorized by:   Waymon Budge MD   Signed by:   Waymon Budge MD on 04/27/2008   Method used:   Print then Give to Patient   RxID:   [id number]

## 2010-04-25 ENCOUNTER — Encounter: Payer: Self-pay | Admitting: Internal Medicine

## 2010-04-25 ENCOUNTER — Ambulatory Visit (INDEPENDENT_AMBULATORY_CARE_PROVIDER_SITE_OTHER): Payer: 59 | Admitting: Internal Medicine

## 2010-04-25 DIAGNOSIS — J45909 Unspecified asthma, uncomplicated: Secondary | ICD-10-CM

## 2010-04-25 DIAGNOSIS — J309 Allergic rhinitis, unspecified: Secondary | ICD-10-CM

## 2010-04-25 DIAGNOSIS — E119 Type 2 diabetes mellitus without complications: Secondary | ICD-10-CM

## 2010-04-25 MED ORDER — FLUTICASONE PROPIONATE 50 MCG/ACT NA SUSP: 2.0000 | Freq: Every day | NASAL | Status: DC

## 2010-04-25 MED ORDER — AZELASTINE HCL 0.1 % NA SOLN: 1.0000 | Freq: Two times a day (BID) | NASAL | Status: DC

## 2010-04-25 MED ORDER — IPRATROPIUM BROMIDE HFA 17 MCG/ACT IN AERS: 2.0000 | INHALATION_SPRAY | Freq: Four times a day (QID) | RESPIRATORY_TRACT | Status: DC

## 2010-04-25 NOTE — Progress Notes (Signed)
  Subjective:    Patient ID: Sonia [name], female    DOB: 17-Sep-1955, 55 y.o.   MRN: [id number]  HPI  55 yo F never smoker (sister Lupie Sawa w/ severe asthma and rhinitis). Last here for f/u of asthma and allergic rhinitis with hx nasal polyps  04/26/09. At one year f/u she denies major events. Around eyes she gets some periorbital dermatitis and watery conjunctival irritation. Not taking Singulair due to cost. Usinjg daily allegra and flonase. Rarely wheezed a little in past year- used atrovent rescue inhaler only once.   Review of Systems Constitutional:   No weight loss, night sweats,  Fevers, chills, fatigue, lassitude. HEENT:   No headaches,  Difficulty swallowing,  Tooth/dental problems,  Sore throat,                No sneezing, itching, ear ache, nasal congestion, post nasal drip,   CV:  No chest pain,  Orthopnea, PND, swelling in lower extremities, anasarca, dizziness, palpitations  GI  No heartburn, indigestion, abdominal pain, nausea, vomiting, diarrhea, change in bowel habits, loss of appetite  Resp: No shortness of breath with exertion or at rest.  No excess mucus, no productive cough,  No non-productive cough,  No coughing up of blood.  No change in color of mucus.  No wheezing.  No chest wall deformity  Skin: no rash or lesions.  GU: no dysuria, change in color of urine, no urgency or frequency.  No flank pain.  MS:  No joint pain or swelling.  No decreased range of motion.  No back pain.  Psych:  No change in mood or affect. No depression or anxiety.  No memory loss.     Objective:   Physical Exam    General- Alert, Oriented, Affect-appropriate, Distress- none acute  Skin- rash-none, lesions- none, excoriation- none. She is wearing makeup, with no visible rash.   Lymphadenopathy- none  Head- atraumatic  Eyes- Gross vision intact, PERRLA, conjunctivae clear, secretions  Ears-Normal-  Hearing, canals, Tm L ,   R ,  Nose- Clear, No- Septal dev, mucus, polyps,  erosion, perforation   Throat- Mallampati II , mucosa clear , drainage- none, tonsils- atrophic  Neck- flexible , trachea midline, no stridor , thyroid nl, carotid no bruit  Chest - symmetrical excursion , unlabored     Heart/CV- RRR , no murmur , no gallop  , no rub, nl s1 s2                     - JVD- none , edema- none, stasis changes- none, varices- none     Lung- clear to P&A, wheeze- none, cough- none , dullness-none, rub- none     Chest wall- atraumatic, no scar  Abd- tender-no, distended-no, bowel sounds-present, HSM- no  Br/ Gen/ Rectal- Not done, not indicated  Extrem- cyanosis- none, clubbing, none, atrophy- none, strength- nl  Neuro- grossly intact to observation     Assessment & Plan:

## 2010-04-25 NOTE — Patient Instructions (Signed)
Meds refilled.- Please call as needed  Consider vaseline around the eye at bedtime as needed  Consider Allway or one of the other allergy eye drops otc. , or Pataday  Astepro coupon if available

## 2010-04-25 NOTE — Assessment & Plan Note (Signed)
Good control. Needs to maintain her rescue inhaler.

## 2010-04-25 NOTE — Assessment & Plan Note (Signed)
Allergic rhinoconjunctivitis with  Some periorbital dermatitiis. Suggest vaseline around the eye Allway or naphcon-a eyedrops Use the flonase twice daily now during peak season.

## 2010-04-26 ENCOUNTER — Encounter: Payer: Self-pay | Admitting: Internal Medicine

## 2010-06-17 NOTE — Discharge Summary (Signed)
NAMECATHALINA, [name]                ACCOUNT NO.:  [id number]   MEDICAL RECORD NO.:  [id number]          PATIENT TYPE:  INP   LOCATION:  3021                         FACILITY:  [facility]   PHYSICIAN:  Lonia Blood, M.D.DATE OF BIRTH:  10-01-1955   DATE OF ADMISSION:  11/15/2007  DATE OF DISCHARGE:  11/21/2007                               DISCHARGE SUMMARY   PRIMARY CARE PHYSICIAN:  Dr. Merri Brunette.   DISCHARGE DIAGNOSES:  1. Newly diagnosed diabetes mellitus.      a.     Presenting symptoms, severe diabetic ketoacidosis.      b.     Full diabetic education initiated.      c.     Aspirin therapy initiated.      d.     ACE inhibitor therapy initiated.      e.     Oral therapy plus insulin therapy initiated.  2. Reported history of asthma - Well compensated.  3. Bibasilar community-acquired pneumonia - Empiric antibiotic      therapy.  4. History of herpes simplex virus 2  - Continue Zovirax.  5. Gastroesophageal reflux disease.  6. Status post total abdominal hysterectomy.  7. Allergic rhinitis  8. REPORTED ALLERGIES TO AMPICILLIN, AZITHROMYCIN, CIPROFLOXACIN,      PENICILLIN, AND AMOXICILLIN.   DISCHARGE MEDICATIONS:  1. Lantus insulin 25 units subcu q.h.s.  2. Glucophage 1000 mg p.o. b.i.d. with meals.  3. Lisinopril 10 mg p.o. daily.  4. Levaquin 500 mg p.o. daily x4 days then stop.  5. Aspirin 81 mg enteric coated p.o. daily.  6. Emerge 2.5 mg p.r.n. headaches.  7. Allegra 180 mg p.o. daily.  8. Acyclovir 200 mg p.o. daily.  9. Flonase nasal spray 1 puff in each nostril daily p.r.n.  10.Nexium 40 mg p.o. daily  11.Singulair 10 mg p.o. daily.   FOLLOWUP:  Patient is advised to follow up Dr. Merri Brunette in  approximately 7 days for reevaluation.  She is instructed to take her  CBG log with her so that Dr. Renne Crigler may determine if further adjustment  of her diabetes medications is required.  She is advised to check her  blood sugar t.i.d. until such time that she sees  Dr. Renne Crigler.   PROCEDURES:  None.   CONSULTATIONS:  None   HOSPITAL COURSE:  Ms. Sonia [name] is a very pleasant 55 year old  female.  She presented to the hospital on November 15, 2007, with  complaints of severe shortness of breath and generalized abdominal pain.  She noted that in the weeks prior to her admission she had been  suffering with severe increased thirst.  On evaluation, the patient was  found to be in DKA.  She had no known prior history of diabetes.  Patient was treated with an IV insulin drip that was titrated as per the  Glucommander protocol.  Aggressive volume resuscitation was carried out.  Potassium supplementation was administered.  Though the patient was  somewhat refractory to treatment, she did finally respond and her anion  gap closed with normalization of her bicarb.  Patient was able to be  titrated off the insulin drip.  She was transitioned to Lantus insulin.  At such time that the patient's acidosis had been corrected for greater  than 48 hours, Glucophage was initiated.  Chest x-ray after hydration  revealed evidence of bilateral basilar pulmonary infiltrates.  This was  felt to be a probable community-acquired pneumonia and was likely the  inciting incident that brought about the DKA.  The patient did have a  leukocytosis to match.  She was treated with Levaquin and tolerated this  without difficulty.  Her asthma remained well controlled.  ACE inhibitor  was empirically initiated in this diabetic patient, as well as aspirin  therapy.  In the days following resolution of her DKA, the patient had  continued to suffer with hypokalemia and required supplemental  replacement.  At the present time, a recheck of potassium is pending.  Should this value return at greater than 3.2, the patient will be  cleared for discharge home.  She is advised to check her CBGs on a  t.i.d. basis and to present to Dr. Carolee Rota office within 7 days for  reevaluation of her  diabetes and for review of her CBG log.  Prior to  her discharge, it is assured that she is able to inject herself with her  insulin and that she is comfortable checking her own CBGs.      Lonia Blood, M.D.  Electronically Signed     JTM/MEDQ  D:  11/21/2007  T:  11/21/2007  Job:  [id number]   cc:   Soyla Murphy. Renne Crigler, M.D.

## 2010-06-17 NOTE — H&P (Signed)
[name], [name]                ACCOUNT NO.:  [id number]   MEDICAL RECORD NO.:  [id number]          PATIENT TYPE:  INP   LOCATION:  3301                         FACILITY:  [facility]   PHYSICIAN:  Ladell Pier, M.D.   DATE OF BIRTH:  [birth year]   DATE OF ADMISSION:  11/15/2007  DATE OF DISCHARGE:                              HISTORY & PHYSICAL   CHIEF COMPLAINT:  Abdominal pain and shortness of breath.   HISTORY OF PRESENT ILLNESS:  The patient is a 55 year old white female  that presented to [facility] emergency department complaining of  abdominal pain that has been going on for about a week.  She also noted  for the past couple of weeks that she has been having increased thirst.  She has not noticed any polyuria, no fevers, no shortness of breath, no  chest pain.  She stated that she had a physical back in March 2009 with  Dr. Renne [name], and everything came back normal.  The patient did not have  any dysuria.  No headache.   PAST MEDICAL HISTORY:  1. Asthma.  2. GERD.  3. Genital herpes.  4. Total abdominal hysterectomy.  5. Allergic rhinitis.  6. Cysts removed from ovaries in 2004.   FAMILY HISTORY:  Mother is healthy.  Father died of leukemia.   SOCIAL HISTORY:  No tobacco or alcohol use.  She has had a significant  other for over 20 years.  She has no children.  She works for [employer].   MEDICATIONS:  1. Nexium 40 mg daily.  2. Singulair 10 mg daily.  3. Allegra 180 mg daily.  4. Acyclovir 200 mg daily.  5. Flonase nasal spray daily.   ALLERGIES:  1. AMPICILLIN.  2. AZITHROMYCIN.  3. CIPRO.  4. PENICILLIN.  5. AMOXICILLIN.   REVIEW OF SYSTEMS:  Negative, otherwise stated in the HPI.   PHYSICAL EXAMINATION:  VITAL SIGNS:  Temperature 97.8, pulse of 116,  blood pressure 110/56, pulse ox 93% on room air.  GENERAL:  Patient  lying in bed in no acute distress.  HEENT:  Normocephalic, atraumatic.  Pupils reactive to light.  Throat  without erythema.  CARDIOVASCULAR:  She is tachycardiac.  LUNGS:  Clear bilaterally.  ABDOMEN:  Soft, nontender, nondistended.  Positive bowel sounds.  EXTREMITIES:  Without edema.   LABORATORY DATA:  Sodium 138, potassium 5.1, chloride 109, CO2 less than  5, glucose 579, BUN 15, creatinine 1.1, calcium 9.1.  Urine has 3-6  WBCs.  UA greater than 80 ketones.  I-stat; pH 6.93, pCO2 10.6, CO2 of  128, bicarb 2.3, percent sat 96%.  WBC 17, hemoglobin 14.5, MCV 88.5,  platelets 267.   ASSESSMENT/PLAN:  1. Diabetic ketoacidosis that is severe with pH less than 7.  2. Abdominal pain, now resolved.  3. Asthma.  4. Gastroesophageal reflux disease.  5. Genital herpes.  6. Leukocytosis.   Will admit the patient to the hospital.  Will treat for DKA with IV  insulin drip as per the DKA protocol.  Since her pH is less than 7 per  up to date recommendation,  we will give her bicarb at 200 mL an hour x2  hours with 20 mEq of K.  Will also check BMP q.2 h., and then q.4 h.,  once the gap is near to complete.  Her anion gap is presently 24.  She  does not seem to have a urinary tract infection, but because she had 3-6  WBCs in her urine, we will check a urine culture.  Will get EKG, as well  as hemoglobin A1c.  Will check fasting lipid panel and continue her on  her home medication.      Ladell Pier, M.D.  Electronically Signed     NJ/MEDQ  D:  11/15/2007  T:  11/15/2007  Job:  [id number]   cc:   Sonia [name]. Sonia [name], M.D.

## 2010-06-20 NOTE — Op Note (Signed)
[facility]  Patient:    Sonia [name], Sonia [name] Visit Number: [id number] MRN: [id number]          Service Type: DSU Location: DAY Attending Physician:  Collene Schlichter Dictated by:   Almedia Balls. Randell Patient, M.D. Proc. Date: 01/13/01 Admit Date:  01/13/2001   CC:         Harl Bowie, M.D.   Operative Report  PREOPERATIVE DIAGNOSES: 1. Rapid uterine enlargement. 2. Abnormal bleeding.  POSTOPERATIVE DIAGNOSES: 1. Rapid uterine enlargement. 2. Abnormal uterine bleeding. 3. Pending pathology.  OPERATION:  Abdominal supracervical hysterectomy.  ANESTHESIA:  General orotracheal.  SURGEON:  Almedia Balls. Randell Patient, M.D.  ASSISTANT:  Harl Bowie, M.D.  INDICATIONS:  The patient is a 55 year old with the above-noted problems who was counseled as to the need for surgery to treat this problem. She was fully counseled as to the nature of the procedure and the risks involved including risks of anesthesia, injury to bowel, bladder, blood vessels, ureters, postoperative hemorrhage, infection, recuperation, and the use of hormone replacement should her ovaries be removed. She fully understands all these considerations and wishes to proceed on January 13, 2001.  OPERATIVE FINDINGS:  On entry into the abdomen, the uterus was approximately 12 to [redacted] weeks gestational size with a very large fundal myoma and multiple small subserous myomata. Both ovaries appeared normal except that there was a small simple cyst present on the left ovary. Both tubes appeared normal. Exploration of the upper abdomen revealed the lower liver edge, gallbladder, spleen, kidneys, periaortic areas, and appendix to be normal to visualization and/or palpation.  DESCRIPTION OF PROCEDURE:  With the patient under general anesthesia, prepared and draped in the usual sterile fashion, with the Foley catheter in the bladder, a lower abdominal transverse incision was made and carried into  the peritoneal cavity without difficulty. Self-retaining retractor was placed, and the bowel was packed off. Kelly clamps were used to clamp the utero-ovarian anastomoses, tubes, and round ligaments bilaterally for traction and hemostasis. Bovie electrocoagulation was used for transection of the round ligaments bilaterally with development of the bladder flap anteriorly and entry into the retroperitoneal space. Infundibulopelvic ligaments were identified. The utero-ovarian anastomoses and tubes bilaterally were then clamped, cut, and doubly ligated with #1 chromic catgut for conservation of the ovaries. Uterine vessels were then skeletonized, and the bladder flap was advanced over the lower uterine segment and cervix. Heaney clamps were used to clamp the uterine vessels and cardinal ligaments bilaterally; these structures were individually cut free and individually suture ligated with     #1 chromic catgut. It was then possible to excise the uterine fundus which was accomplished using Bovie electrocoagulation. Cervical stump was rendered hemostatic and reapproximated with interrupted figure-of-eight sutures of #1 chromic catgut. The area was reperitonealized with an interrupted suture of #1 chromic catgut. The area was lavaged with copious amounts of lactated Ringers solution and after noting that hemostasis was maintained and the sponge and instrument counts were correct, and after suspending the ovaries to the round ligaments bilaterally with interrupted sutures of #1 chromic catgut, the peritoneum was closed with a continuous suture of 0 Vicryl. Fascia was closed with two sutures of 0 Vicryl which were brought from the lateral aspects of the incision and tied separately in the midline. The subcutaneous fat was reapproximated with interrupted sutures of 0 Vicryl. Skin was closed with a subcuticular suture of 3-0 plain catgut. Estimated blood loss was 100 ml. The patient was taken to the  recovery  room in good condition with clear urine in the Foley catheter tubing. She will be placed on 23-hour observation following surgery. Dictated by:   Almedia Balls Randell Patient, M.D. Attending Physician:  Collene Schlichter DD:  01/13/01 TD:  01/13/01 Job: [id number]

## 2010-06-20 NOTE — Assessment & Plan Note (Signed)
[facility]                             PULMONARY OFFICE NOTE   NAME:Sonia [name], Sonia [name]                       MRN:          [id number]  DATE:04/30/2006                            DOB:          [birth year]    PROBLEMS:  1. Asthma.  2. Allergic rhinitis.  3. History of nasal polyposis.   HISTORY:  One year follow up. This has been a good year overall.  Recently, she has been uncomfortable because of persistent watering of  the right eye. It may burn and itch a little. She has been working with  her eye doctor, but has not been taken off of contact lenses. She has  tried a number of antibiotic allergy and antiinflammatory eye drops. She  remembers that Drixoral helped in the past and we discussed this, as  well as available alternatives.   MEDICATIONS:  1. Singulair.  2. Flonase.  3. Topamax 100 mg.  4. Nexium.  5. Prilosec.  6. Amerge.  7. Allegra 180 mg.  8. Estravon.  9. Albuterol rescue inhaler used rarely.  10.Atrovent HFA inhaler used rarely.  Drug intolerant of PENICILLIN, CIPRO, and ZITHROMAX.   OBJECTIVE:  Weight 147 pounds, blood pressure 96/62, pulse regular 64,  room air saturation 99%. She is wearing contact lenses. Conjunctivae do  not look injected either over the surface of the globe or under the  lids, although maybe the lid surface is a little reddened. Nasal airway  looks normal. There is no evidence of post nasal drainage, breathing is  unlabored. Heart sounds regular without murmur.   IMPRESSION:  1. Allergic conjunctivitis, possible irritant effect, possible      impaired drainage right eye only.  2. Asthma and allergic rhinitis, currently stable.   PLAN:  1. Refill Fluticasone nasal spray.  2. Try replacing contact lenses with eye glasses for at least one      week.  3. Sample Optivar eye drops once each eye b.i.d.  4. Sample Allegra-D 12 hour 1 b.i.d. as tolerated.  5. Try Lacrilube ointment.  6. I will see her  back as needed, but we are scheduling follow up in      one year. She will continue to work with her eye doctor.     Clinton D. Maple Hudson, MD, Tonny Bollman, FACP  Electronically Signed    CDY/MedQ  DD: 04/30/2006  DT: 05/01/2006  Job #: [id number]

## 2010-06-20 NOTE — H&P (Signed)
[facility]  Patient:    Sonia [name], Sonia [name] Visit Number: [id number] MRN: [id number]          Service Type: Attending:  Almedia Balls. Randell Patient, M.D. Dictated by:   Almedia Balls Randell Patient, M.D. Adm. Date:  01/13/01                           History and Physical  CHIEF COMPLAINT:  Uterine enlargement, abnormal bleeding.  HISTORY OF PRESENT ILLNESS:  The patient is a 55 year old, gravida 0, who has been followed in our office over several years.  At the time of her last normal annual exam in February 2002, the uterus was noted to be top normal in size.  Pap smear at that time was within normal limits.  More recent exam in November 2002, revealed uterus to be mid posterior, 12+ weeks gestational size, and very firm.  Ultrasound was performed, showing the uterus to be enlarged, with a large echogenic mass within the uterus.  The ovaries appeared to be normal.  This situation was discussed at length with the patient, and it was felt that she should undergo surgery, to include abdominal hysterectomy, possible bilateral salpingo-oophorectomy.  This procedure was fully discussed with her to include the risks of anesthesia, risks of injury to bowel, bladder, blood vessels, ureters, postoperative hemorrhage, infection, and recuperation, and the possibility of hormone replacement.  She fully understands all these considerations and wishes to proceed on January 13, 2001.  PAST MEDICAL HISTORY:  Essentially noncontributory.  FAMILY HISTORY:  Grandmother died of uterine sarcoma.  SOCIAL HISTORY:  The patient is a nonsmoker.  REVIEW OF SYSTEMS:  HEENT:  Occasional headaches.  CARDIORESPIRATORY:  History of asthma as a child.  GASTROINTESTINAL:  Negative.  GENITOURINARY:  As in present illness.  NEUROMUSCULAR:  Negative.  PHYSICAL EXAMINATION:  VITAL SIGNS:  Height 5 feet 3-1/2 inches, weight 140 pounds.  Blood pressure 104/68, pulse 72, respirations 18.  GENERAL:   Well-developed white female in no acute distress.  HEENT:  Within normal limits.  NECK:  Supple.  Without masses, adenopathy, or bruits.  HEART:  Regular rate and rhythm.  Without murmurs.  BREASTS:  In sitting and lying positions, without mass.  Axilla negative.  ABDOMEN:  Flat and soft.  Without palpable mass.  Nontender.  PELVIC:  External genitalia, Bartholin, urethral, and Skenes glands within normal limits.  Cervix is slightly inflamed.  Uterus is mid posterior position, approximately 12-[redacted] weeks gestational size, quite firm.  There are no palpable adnexal masses.  EXTREMITIES:  Within normal limits.  CNS:  Grossly intact.  SKIN:  Without suspicious lesions.  IMPRESSION:  Uterine enlargement, question myomata.  PLAN:  As noted above. Dictated by:   Almedia Balls Randell Patient, M.D. Attending:  Almedia Balls. Randell Patient, M.D. DD:  01/10/01 TD:  01/11/01 Job: [id number]

## 2010-06-20 NOTE — Op Note (Signed)
   Sonia [name], Sonia [name]                          ACCOUNT NO.:  [id number]   MEDICAL RECORD NO.:  [id number]                   PATIENT TYPE:  AMB   LOCATION:  ENDO                                 FACILITY:  [facility]   PHYSICIAN:  Georgiana Spinner, M.D.                 DATE OF BIRTH:  Mar 25, 1955   DATE OF PROCEDURE:  DATE OF DISCHARGE:                                 OPERATIVE REPORT   PROCEDURE:  Upper endoscopy with biopsy.   INDICATIONS FOR PROCEDURE:  Gastroesophageal reflux disease.   ANESTHESIA:  Demerol 40 mg, Versed 4 mg and 25 mg of Phenergan were given  intravenously.   DESCRIPTION OF PROCEDURE:  With the patient mildly sedated in the left  lateral decubitus position, the Olympus videoscopic endoscope was inserted  in the mouth, passed under direct vision through the esophagus which  appeared normal except for the distal esophagus where there was a question  of Barrett's or esophagitis seen, photographed and biopsied. We entered into  the stomach through a hiatal hernia. The fundus, body, antrum, duodenal  bulb, and second portion of the duodenum all appeared normal and photographs  were taken. From this point, the endoscope was slowly withdrawn taking  circumferential views of the duodenal mucosa until the mucosa was then  pulled back into the stomach, placed in retroflexion to view the stomach  from below. The endoscope was then straightened and withdrawn taking  circumferential views of the remaining gastric and esophageal mucosa. The  patient's vital signs and pulse oximeter remained stable. The patient  tolerated the procedure well without apparent complications.   FINDINGS:  Esophagitis versus Barrett's esophagus biopsied, await biopsy  report. The patient will call me for results and followup with me as an  outpatient.                                               Georgiana Spinner, M.D.    GMO/MEDQ  D:  12/01/2002  T:  12/01/2002  Job:  [id number]

## 2010-06-20 NOTE — Op Note (Signed)
[name], Sonia [name]                ACCOUNT NO.:  [id number]   MEDICAL RECORD NO.:  [id number]          PATIENT TYPE:  AMB   LOCATION:  DSC                          FACILITY:  [facility]   PHYSICIAN:  Lubertha Basque. Dalldorf, M.D.DATE OF BIRTH:  October 17, 1955   DATE OF PROCEDURE:  08/11/2005  DATE OF DISCHARGE:                                 OPERATIVE REPORT   PREOPERATIVE DIAGNOSIS:  Left shoulder postoperative stiffness.   POSTOPERATIVE DIAGNOSIS:  Left shoulder postoperative stiffness.   PROCEDURE:  1.  Left shoulder closed manipulation.  2.  Left shoulder intra-articular injection.   ANESTHESIA:  General mask.   ATTENDING SURGEON:  Lubertha Basque. Jerl Santos, M.D.   ASSISTANT:  Lindwood Qua, P.A.   INDICATIONS FOR PROCEDURE:  The patient is a 55 year old woman about 5  months from a left shoulder arthroscopy.  She has persisted with significant  stiffness after her surgery despite aggressive physical therapy and an intra-  articular injection.  She has significant limitation of motion and can only  bring her arm up to 90 or 100 degrees.  She is offered a closed manipulation  with an injection at this point.  Informed operative consent was obtained  after discussion of possible complications of fracture.   SUMMARY AND FINDINGS:  Under general mask anesthesia, a closed manipulation  was performed improving her forward flexion from 100 degrees to 150 degrees  and also improving her rotation, internal and external, to a level equal to  the opposite side.  She was also given an injection into the glenohumeral  joint at the end of the case.   DESCRIPTION OF PROCEDURE:  The patient was taken to the  operating suite  where general anesthetic was applied by mask.  She was positioned supine on  her stretcher.  Once adequate anesthesia had been achieved, we performed a  closed manipulation.  I first brought her arm into forward flexion from 100  to 150 degrees or more with audible pops along the way.   I then worked on  her internal and external rotation and these basically equaled the opposite  side once we were done.  Again some audible polyps were encountered in both  directions.  I then rolled her on her side and after prep of an area near  the posterior portal, I injected the glenohumeral joint with 1 mL of Depo  and 6 mL of Marcaine without epinephrine.  About 80 mg of Depo were placed.   ESTIMATED BLOOD LOSS:  None.   Intraoperative fluids can be obtained from anesthesia records.   DISPOSITION:  The patient was taken to recovery room in stable addition.  Plans were for her to go home the same-day and to follow up in the office in  less than a week.  I will contact her by phone tonight.  She will resume  physical therapy immediately.      Lubertha Basque Jerl Santos, M.D.  Electronically Signed     PGD/MEDQ  D:  08/11/2005  T:  08/11/2005  Job:  [id number]

## 2010-06-20 NOTE — Op Note (Signed)
Sonia [name], Sonia [name]                ACCOUNT NO.:  [id number]   MEDICAL RECORD NO.:  [id number]          PATIENT TYPE:  AMB   LOCATION:  ENDO                         FACILITY:  [facility]   PHYSICIAN:  Georgiana Spinner, M.D.    DATE OF BIRTH:  10-05-1955   DATE OF PROCEDURE:  09/22/2005  DATE OF DISCHARGE:                                 OPERATIVE REPORT   PROCEDURE:  Colonoscopy.   INDICATIONS:  Colon cancer screening.  Question of polyp seen on CT.   ANESTHESIA:  Fentanyl 15 mcg, Versed 5 mg.   PROCEDURE:  With the patient mildly sedated in the left lateral decubitus  position, the Olympus videoscopic colonoscope was inserted into the rectum  and passed through a fairly spastic sigmoid colon but once we cleared this  we were able to advance the colonoscope under direct vision to the cecum  identified by ileocecal valve and appendiceal orifice both of which were  photographed.  From this point the colonoscope was then slowly withdrawn  taking circumferential views of colonic mucosa as we withdrew all the way to  the rectum which appeared normal on direct and retroflexed view.  The  endoscope was straightened and withdrawn.  The patient's vital signs, pulse  oximeter remained stable.  The patient tolerated procedure well without  apparent complications.   FINDINGS:  Spasm of sigmoid colon but otherwise an unremarkable colonoscopic  examination to the cecum.   PLAN:  Will have patient follow-up with me as needed as an outpatient.           ______________________________  Georgiana Spinner, M.D.     GMO/MEDQ  D:  09/22/2005  T:  09/22/2005  Job:  [id number]   cc:   Almedia Balls. Randell Patient, M.D.

## 2010-06-20 NOTE — Assessment & Plan Note (Signed)
[facility]                                 ON-CALL NOTE   NAME:[name], Sonia SEYBOLD                       MRN:          [id number]  DATE:07/17/2006                            DOB:          Jun 22, 1955    PHYSICIAN:  Dr. Jetty Duhamel.   PROBLEM:  Patient needed a refill Atrovent inhaler.   PLAN:  Atrovent HFA inhaler was [facility] [phone], refill p.r.n.     Clinton D. Maple Hudson, MD, Tonny Bollman, FACP  Electronically Signed    CDY/MedQ  DD: 07/19/2006  DT: 07/19/2006  Job #: [id number]

## 2010-06-20 NOTE — Discharge Summary (Signed)
[facility]  Patient:    Sonia [name], Sonia [name] Visit Number: [id number] MRN: [id number]          Service Type: SUR Location: 4W [id number] 01 Attending Physician:  Collene Schlichter Dictated by:   Almedia Balls Randell Patient, M.D. Admit Date:  01/13/2001 Discharge Date: 01/14/2001                             Discharge Summary  HISTORY OF PRESENT ILLNESS:  The patient is a 55 year old with rapid uterine enlargement and abnormal uterine bleeding for hysterectomy and possible bilateral salpingo-oophorectomy on January 13, 2001.  The remainder of her history and physical are as previously dictated.  LABORATORY DATA:  Preoperative hemoglobin 12.4.  Electrocardiogram normal.  HOSPITAL COURSE:  The patient was taken to the operating room on January 13, 2001, at which time abdominal supracervical hysterectomy was performed.  The patient did well postoperatively.  Diet and ambulation were progressed over the evening of January 13, 2001, and early morning January 14, 2001.  On the morning of January 14, 2001, she was afebrile and experiencing no problems. It was felt that she could be discharged at this time.  FINAL DIAGNOSES: 1. Rapid uterine enlargement. 2. Abnormal uterine bleeding. 3. Uterine fibroids.  OPERATIONS:  Abdominal supracervical hysterectomy.  Pathology report unavailable at the time of dictation.  DISPOSITION:  Discharged home to return to the office in approximately two weeks for follow-up.  ACTIVITY:  She was instructed to gradually progress her activities over several weeks at home and to limit lifting and driving for two weeks.  She was full ambulatory.  DIET:  Regular.  CONDITION ON DISCHARGE:  Good.  DISCHARGE MEDICATIONS:  She was given a prescription for Dilaudid 2 mg generic #30 to be taken one or two q.4h. p.r.n. pain and doxycycline 100 mg #12 to be taken one b.i.d.  SPECIAL INSTRUCTIONS:  She will call for any problems. Dictated by:    Almedia Balls Randell Patient, M.D. Attending Physician:  Collene Schlichter DD:  01/14/01 TD:  01/14/01 Job: [id number]

## 2010-11-03 LAB — COMPREHENSIVE METABOLIC PANEL
ALT: 16
ALT: 16
AST: 16
AST: 18
Albumin: 2.6 — ABNORMAL LOW
Albumin: 2.9 — ABNORMAL LOW
Alkaline Phosphatase: 49
Alkaline Phosphatase: 49
BUN: 1 — ABNORMAL LOW
BUN: 8
CO2: 12 — ABNORMAL LOW
CO2: 27
Calcium: 7.5 — ABNORMAL LOW
Calcium: 8.2 — ABNORMAL LOW
Chloride: 111
Chloride: 117 — ABNORMAL HIGH
Creatinine, Ser: 0.4
Creatinine, Ser: 0.74
GFR calc Af Amer: >60
GFR calc Af Amer: >60
GFR calc non Af Amer: >60
GFR calc non Af Amer: >60
Glucose, Bld: 130 — ABNORMAL HIGH
Glucose, Bld: 273 — ABNORMAL HIGH
Potassium: 2.7 — CL
Potassium: 3.3 — ABNORMAL LOW
Sodium: 133 — ABNORMAL LOW
Sodium: 141
Total Bilirubin: 0.4
Total Bilirubin: 0.7
Total Protein: 4.5 — ABNORMAL LOW
Total Protein: 4.9 — ABNORMAL LOW

## 2010-11-03 LAB — CBC
HCT: 32.9 — ABNORMAL LOW
HCT: 33.8 — ABNORMAL LOW
HCT: 35.5 — ABNORMAL LOW
Hemoglobin: 11 — ABNORMAL LOW
Hemoglobin: 11.1 — ABNORMAL LOW
Hemoglobin: 11.9 — ABNORMAL LOW
MCHC: 32.6
MCHC: 33.4
MCHC: 33.7
MCV: 85.6
MCV: 85.8
MCV: 87
Platelets: 160
Platelets: 176
Platelets: 177
RBC: 3.85 — ABNORMAL LOW
RBC: 3.89
RBC: 4.14
RDW: 15.1
RDW: 15.2
RDW: 15.8 — ABNORMAL HIGH
WBC: 10
WBC: 5.8
WBC: 6.6

## 2010-11-03 LAB — BASIC METABOLIC PANEL
BUN: 1 — ABNORMAL LOW
BUN: 10
BUN: 13
BUN: 2 — ABNORMAL LOW
BUN: 2 — ABNORMAL LOW
BUN: 2 — ABNORMAL LOW
BUN: 2 — ABNORMAL LOW
BUN: 2 — ABNORMAL LOW
BUN: 2 — ABNORMAL LOW
BUN: 3 — ABNORMAL LOW
BUN: 3 — ABNORMAL LOW
BUN: 3 — ABNORMAL LOW
BUN: 4 — ABNORMAL LOW
BUN: 4 — ABNORMAL LOW
BUN: 4 — ABNORMAL LOW
BUN: 4 — ABNORMAL LOW
BUN: 5 — ABNORMAL LOW
BUN: 5 — ABNORMAL LOW
BUN: 5 — ABNORMAL LOW
BUN: 6
BUN: 7
BUN: 7
BUN: <1 — ABNORMAL LOW
CO2: 10 — ABNORMAL LOW
CO2: 10 — ABNORMAL LOW
CO2: 13 — ABNORMAL LOW
CO2: 13 — ABNORMAL LOW
CO2: 13 — ABNORMAL LOW
CO2: 14 — ABNORMAL LOW
CO2: 14 — ABNORMAL LOW
CO2: 16 — ABNORMAL LOW
CO2: 18 — ABNORMAL LOW
CO2: 20
CO2: 20
CO2: 22
CO2: 22
CO2: 22
CO2: 26
CO2: 26
CO2: 26
CO2: 27
CO2: 27
CO2: 27
CO2: 27
CO2: 27
CO2: 5 — CL
Calcium: 7.4 — ABNORMAL LOW
Calcium: 7.5 — ABNORMAL LOW
Calcium: 7.5 — ABNORMAL LOW
Calcium: 7.7 — ABNORMAL LOW
Calcium: 7.8 — ABNORMAL LOW
Calcium: 7.8 — ABNORMAL LOW
Calcium: 7.8 — ABNORMAL LOW
Calcium: 7.9 — ABNORMAL LOW
Calcium: 7.9 — ABNORMAL LOW
Calcium: 8 — ABNORMAL LOW
Calcium: 8 — ABNORMAL LOW
Calcium: 8.1 — ABNORMAL LOW
Calcium: 8.2 — ABNORMAL LOW
Calcium: 8.2 — ABNORMAL LOW
Calcium: 8.2 — ABNORMAL LOW
Calcium: 8.2 — ABNORMAL LOW
Calcium: 8.3 — ABNORMAL LOW
Calcium: 8.3 — ABNORMAL LOW
Calcium: 8.4
Calcium: 8.4
Calcium: 8.5
Calcium: 8.7
Calcium: 8.9
Chloride: 105
Chloride: 106
Chloride: 107
Chloride: 108
Chloride: 109
Chloride: 109
Chloride: 112
Chloride: 112
Chloride: 113 — ABNORMAL HIGH
Chloride: 114 — ABNORMAL HIGH
Chloride: 115 — ABNORMAL HIGH
Chloride: 116 — ABNORMAL HIGH
Chloride: 116 — ABNORMAL HIGH
Chloride: 117 — ABNORMAL HIGH
Chloride: 117 — ABNORMAL HIGH
Chloride: 118 — ABNORMAL HIGH
Chloride: 118 — ABNORMAL HIGH
Chloride: 119 — ABNORMAL HIGH
Chloride: 120 — ABNORMAL HIGH
Chloride: 120 — ABNORMAL HIGH
Chloride: 121 — ABNORMAL HIGH
Chloride: 122 — ABNORMAL HIGH
Chloride: 125 — ABNORMAL HIGH
Creatinine, Ser: 0.42
Creatinine, Ser: 0.47
Creatinine, Ser: 0.48
Creatinine, Ser: 0.5
Creatinine, Ser: 0.52
Creatinine, Ser: 0.52
Creatinine, Ser: 0.53
Creatinine, Ser: 0.53
Creatinine, Ser: 0.55
Creatinine, Ser: 0.56
Creatinine, Ser: 0.56
Creatinine, Ser: 0.57
Creatinine, Ser: 0.57
Creatinine, Ser: 0.64
Creatinine, Ser: 0.64
Creatinine, Ser: 0.65
Creatinine, Ser: 0.65
Creatinine, Ser: 0.65
Creatinine, Ser: 0.7
Creatinine, Ser: 0.72
Creatinine, Ser: 0.76
Creatinine, Ser: 0.89
Creatinine, Ser: 1.08
GFR calc Af Amer: >60
GFR calc Af Amer: >60
GFR calc Af Amer: >60
GFR calc Af Amer: >60
GFR calc Af Amer: >60
GFR calc Af Amer: >60
GFR calc Af Amer: >60
GFR calc Af Amer: >60
GFR calc Af Amer: >60
GFR calc Af Amer: >60
GFR calc Af Amer: >60
GFR calc Af Amer: >60
GFR calc Af Amer: >60
GFR calc Af Amer: >60
GFR calc Af Amer: >60
GFR calc Af Amer: >60
GFR calc Af Amer: >60
GFR calc Af Amer: >60
GFR calc Af Amer: >60
GFR calc Af Amer: >60
GFR calc Af Amer: >60
GFR calc Af Amer: >60
GFR calc Af Amer: >60
GFR calc non Af Amer: 53 — ABNORMAL LOW
GFR calc non Af Amer: >60
GFR calc non Af Amer: >60
GFR calc non Af Amer: >60
GFR calc non Af Amer: >60
GFR calc non Af Amer: >60
GFR calc non Af Amer: >60
GFR calc non Af Amer: >60
GFR calc non Af Amer: >60
GFR calc non Af Amer: >60
GFR calc non Af Amer: >60
GFR calc non Af Amer: >60
GFR calc non Af Amer: >60
GFR calc non Af Amer: >60
GFR calc non Af Amer: >60
GFR calc non Af Amer: >60
GFR calc non Af Amer: >60
GFR calc non Af Amer: >60
GFR calc non Af Amer: >60
GFR calc non Af Amer: >60
GFR calc non Af Amer: >60
GFR calc non Af Amer: >60
GFR calc non Af Amer: >60
Glucose, Bld: 102 — ABNORMAL HIGH
Glucose, Bld: 130 — ABNORMAL HIGH
Glucose, Bld: 132 — ABNORMAL HIGH
Glucose, Bld: 136 — ABNORMAL HIGH
Glucose, Bld: 140 — ABNORMAL HIGH
Glucose, Bld: 154 — ABNORMAL HIGH
Glucose, Bld: 159 — ABNORMAL HIGH
Glucose, Bld: 164 — ABNORMAL HIGH
Glucose, Bld: 171 — ABNORMAL HIGH
Glucose, Bld: 193 — ABNORMAL HIGH
Glucose, Bld: 197 — ABNORMAL HIGH
Glucose, Bld: 210 — ABNORMAL HIGH
Glucose, Bld: 225 — ABNORMAL HIGH
Glucose, Bld: 239 — ABNORMAL HIGH
Glucose, Bld: 245 — ABNORMAL HIGH
Glucose, Bld: 262 — ABNORMAL HIGH
Glucose, Bld: 262 — ABNORMAL HIGH
Glucose, Bld: 266 — ABNORMAL HIGH
Glucose, Bld: 273 — ABNORMAL HIGH
Glucose, Bld: 308 — ABNORMAL HIGH
Glucose, Bld: 322 — ABNORMAL HIGH
Glucose, Bld: 91
Glucose, Bld: 94
Potassium: 2.8 — ABNORMAL LOW
Potassium: 2.8 — ABNORMAL LOW
Potassium: 3.1 — ABNORMAL LOW
Potassium: 3.3 — ABNORMAL LOW
Potassium: 3.3 — ABNORMAL LOW
Potassium: 3.3 — ABNORMAL LOW
Potassium: 3.4 — ABNORMAL LOW
Potassium: 3.4 — ABNORMAL LOW
Potassium: 3.5
Potassium: 3.6
Potassium: 3.6
Potassium: 3.6
Potassium: 3.7
Potassium: 3.7
Potassium: 3.8
Potassium: 3.9
Potassium: 4.1
Potassium: 4.1
Potassium: 4.1
Potassium: 4.2
Potassium: 4.3
Potassium: 4.4
Potassium: 4.4
Sodium: 132 — ABNORMAL LOW
Sodium: 135
Sodium: 135
Sodium: 136
Sodium: 137
Sodium: 138
Sodium: 138
Sodium: 139
Sodium: 139
Sodium: 139
Sodium: 139
Sodium: 139
Sodium: 140
Sodium: 140
Sodium: 140
Sodium: 141
Sodium: 141
Sodium: 142
Sodium: 142
Sodium: 142
Sodium: 142
Sodium: 142
Sodium: 142

## 2010-11-03 LAB — GLUCOSE, CAPILLARY
Glucose-Capillary: 102 — ABNORMAL HIGH
Glucose-Capillary: 103 — ABNORMAL HIGH
Glucose-Capillary: 126 — ABNORMAL HIGH
Glucose-Capillary: 127 — ABNORMAL HIGH
Glucose-Capillary: 133 — ABNORMAL HIGH
Glucose-Capillary: 135 — ABNORMAL HIGH
Glucose-Capillary: 137 — ABNORMAL HIGH
Glucose-Capillary: 140 — ABNORMAL HIGH
Glucose-Capillary: 140 — ABNORMAL HIGH
Glucose-Capillary: 142 — ABNORMAL HIGH
Glucose-Capillary: 151 — ABNORMAL HIGH
Glucose-Capillary: 154 — ABNORMAL HIGH
Glucose-Capillary: 156 — ABNORMAL HIGH
Glucose-Capillary: 160 — ABNORMAL HIGH
Glucose-Capillary: 166 — ABNORMAL HIGH
Glucose-Capillary: 169 — ABNORMAL HIGH
Glucose-Capillary: 174 — ABNORMAL HIGH
Glucose-Capillary: 181 — ABNORMAL HIGH
Glucose-Capillary: 185 — ABNORMAL HIGH
Glucose-Capillary: 185 — ABNORMAL HIGH
Glucose-Capillary: 185 — ABNORMAL HIGH
Glucose-Capillary: 187 — ABNORMAL HIGH
Glucose-Capillary: 189 — ABNORMAL HIGH
Glucose-Capillary: 189 — ABNORMAL HIGH
Glucose-Capillary: 194 — ABNORMAL HIGH
Glucose-Capillary: 204 — ABNORMAL HIGH
Glucose-Capillary: 205 — ABNORMAL HIGH
Glucose-Capillary: 208 — ABNORMAL HIGH
Glucose-Capillary: 217 — ABNORMAL HIGH
Glucose-Capillary: 224 — ABNORMAL HIGH
Glucose-Capillary: 224 — ABNORMAL HIGH
Glucose-Capillary: 230 — ABNORMAL HIGH
Glucose-Capillary: 230 — ABNORMAL HIGH
Glucose-Capillary: 234 — ABNORMAL HIGH
Glucose-Capillary: 236 — ABNORMAL HIGH
Glucose-Capillary: 240 — ABNORMAL HIGH
Glucose-Capillary: 242 — ABNORMAL HIGH
Glucose-Capillary: 246 — ABNORMAL HIGH
Glucose-Capillary: 247 — ABNORMAL HIGH
Glucose-Capillary: 248 — ABNORMAL HIGH
Glucose-Capillary: 251 — ABNORMAL HIGH
Glucose-Capillary: 253 — ABNORMAL HIGH
Glucose-Capillary: 257 — ABNORMAL HIGH
Glucose-Capillary: 260 — ABNORMAL HIGH
Glucose-Capillary: 261 — ABNORMAL HIGH
Glucose-Capillary: 262 — ABNORMAL HIGH
Glucose-Capillary: 264 — ABNORMAL HIGH
Glucose-Capillary: 267 — ABNORMAL HIGH
Glucose-Capillary: 271 — ABNORMAL HIGH
Glucose-Capillary: 272 — ABNORMAL HIGH
Glucose-Capillary: 272 — ABNORMAL HIGH
Glucose-Capillary: 313 — ABNORMAL HIGH
Glucose-Capillary: 315 — ABNORMAL HIGH
Glucose-Capillary: 327 — ABNORMAL HIGH
Glucose-Capillary: 70
Glucose-Capillary: 82
Glucose-Capillary: 87
Glucose-Capillary: 88
Glucose-Capillary: 92
Glucose-Capillary: 93
Glucose-Capillary: 94
Glucose-Capillary: 97

## 2010-11-03 LAB — URINALYSIS, ROUTINE W REFLEX MICROSCOPIC
Bilirubin Urine: NEGATIVE
Glucose, UA: >1000 — AB
Ketones, ur: 15 — AB
Leukocytes, UA: NEGATIVE
Nitrite: NEGATIVE
Protein, ur: NEGATIVE
Specific Gravity, Urine: 1.013
Urobilinogen, UA: 0.2
pH: 6

## 2010-11-03 LAB — LIPID PANEL
Cholesterol: 144
HDL: 43
LDL Cholesterol: 86
Total CHOL/HDL Ratio: 3.3
Triglycerides: 73
VLDL: 15

## 2010-11-03 LAB — URINE CULTURE: Colony Count: 50000

## 2010-11-03 LAB — HEMOGLOBIN A1C
Hgb A1c MFr Bld: 13.9 — ABNORMAL HIGH
Mean Plasma Glucose: 352

## 2010-11-03 LAB — URINE MICROSCOPIC-ADD ON

## 2010-11-03 LAB — MAGNESIUM: Magnesium: 2.3

## 2010-11-03 LAB — TSH: TSH: 0.517

## 2010-11-03 LAB — LACTIC ACID, PLASMA: Lactic Acid, Venous: 1.6

## 2010-11-03 LAB — POTASSIUM: Potassium: 4.5

## 2010-11-04 LAB — GLUCOSE, CAPILLARY
Glucose-Capillary: 323 — ABNORMAL HIGH
Glucose-Capillary: 373 — ABNORMAL HIGH
Glucose-Capillary: 539
Glucose-Capillary: 568

## 2010-11-04 LAB — URINALYSIS, ROUTINE W REFLEX MICROSCOPIC
Bilirubin Urine: NEGATIVE
Glucose, UA: >1000 — AB
Ketones, ur: >80 — AB
Leukocytes, UA: NEGATIVE
Nitrite: NEGATIVE
Protein, ur: 100 — AB
Specific Gravity, Urine: 1.025
Urobilinogen, UA: 0.2
pH: 5.5

## 2010-11-04 LAB — POCT I-STAT 3, ART BLOOD GAS (G3+)
Acid-base deficit: 29 — ABNORMAL HIGH
Bicarbonate: 2.3 — ABNORMAL LOW
O2 Saturation: 96
Patient temperature: 97.7
TCO2: <5
pCO2 arterial: 10.6 — CL
pH, Arterial: 6.932 — CL
pO2, Arterial: 128 — ABNORMAL HIGH

## 2010-11-04 LAB — CBC
HCT: 45.1
Hemoglobin: 14.5
MCHC: 32.3
MCV: 88.5
Platelets: 267
RBC: 5.1
RDW: 14.5
WBC: 17 — ABNORMAL HIGH

## 2010-11-04 LAB — BASIC METABOLIC PANEL
BUN: 15
CO2: <5 — CL
Calcium: 9.1
Chloride: 109
Creatinine, Ser: 1.1
GFR calc Af Amer: >60
GFR calc non Af Amer: 52 — ABNORMAL LOW
Glucose, Bld: 579
Potassium: 5.1
Sodium: 138

## 2010-11-04 LAB — DIFFERENTIAL
Basophils Absolute: 0.1
Basophils Relative: 1
Eosinophils Absolute: 0
Eosinophils Relative: 0
Lymphocytes Relative: 14
Lymphs Abs: 2.3
Monocytes Absolute: 1.4 — ABNORMAL HIGH
Monocytes Relative: 9
Neutro Abs: 13.2 — ABNORMAL HIGH
Neutrophils Relative %: 77

## 2010-11-04 LAB — URINE MICROSCOPIC-ADD ON

## 2011-04-30 ENCOUNTER — Encounter: Payer: Self-pay | Admitting: Internal Medicine

## 2011-04-30 ENCOUNTER — Ambulatory Visit (INDEPENDENT_AMBULATORY_CARE_PROVIDER_SITE_OTHER): Payer: 59 | Admitting: Internal Medicine

## 2011-04-30 VITALS — BP 120/74 | HR 102 | Ht 63.5 in | Wt 151.2 lb

## 2011-04-30 DIAGNOSIS — J330 Polyp of nasal cavity: Secondary | ICD-10-CM

## 2011-04-30 DIAGNOSIS — J309 Allergic rhinitis, unspecified: Secondary | ICD-10-CM

## 2011-04-30 DIAGNOSIS — J45909 Unspecified asthma, uncomplicated: Secondary | ICD-10-CM

## 2011-04-30 MED ORDER — MONTELUKAST SODIUM 10 MG PO TABS: 10.0000 mg | ORAL_TABLET | Freq: Every day | ORAL | Status: DC

## 2011-04-30 MED ORDER — IPRATROPIUM BROMIDE HFA 17 MCG/ACT IN AERS: 2.0000 | INHALATION_SPRAY | Freq: Four times a day (QID) | RESPIRATORY_TRACT | Status: DC

## 2011-04-30 MED ORDER — CLARITHROMYCIN 250 MG PO TABS: 250.0000 mg | ORAL_TABLET | Freq: Two times a day (BID) | ORAL | Status: AC

## 2011-04-30 MED ORDER — FLUTICASONE PROPIONATE 50 MCG/ACT NA SUSP: 2.0000 | Freq: Every day | NASAL | Status: DC

## 2011-04-30 MED ORDER — AZELASTINE HCL 0.1 % NA SOLN: 1.0000 | Freq: Two times a day (BID) | NASAL | Status: DC

## 2011-04-30 NOTE — Patient Instructions (Signed)
Scripts for med refills    Montelukast is generic Singulair  Script for biaxin/ clarithromycin  Antibiotic to hold  Consider trying to rinse your nasal passages with  Saline using the squeeze bottle or Neti pot as discussed  Sample Dymista nasal spray-   Try 1-2 puffs each nostril up to twice daily when needed.  This is like combining your azelastine and fluticasone nasal sprays

## 2011-04-30 NOTE — Progress Notes (Signed)
    Patient ID: Sonia [name], female    DOB: 1955-07-31, 56 y.o.   MRN: [id number]  HPI 04/26/09- 65 yo F never smoker (sister Ellie Bryand w/ severe asthma and rhinitis). Last here for f/u of asthma and allergic rhinitis with hx nasal polyps  . At one year f/u she denies major events. Around eyes she gets some periorbital dermatitis and watery conjunctival irritation. Not taking Singulair due to cost. Using daily allegra and flonase. Rarely wheezed a little in past year- used atrovent rescue inhaler only once.   04/30/11- 56 yo F never smoker (sister Elisabet Gutzmer w/ severe asthma and rhinitis). Last here for f/u of asthma and allergic rhinitis with hx nasal polyps Has been using Sudafed for popping in her ears, malaise and head congestion. Denies chest tightness or wheeze, reflux or GI upset. Tussionex helps.    ROS-see HPI Constitutional:   No-   weight loss, night sweats, fevers, chills, fatigue, lassitude. HEENT:   No-  headaches, difficulty swallowing, tooth/dental problems, sore throat,      + sneezing, itching, ear ache, nasal congestion, post nasal drip,  CV:  No-   chest pain, orthopnea, PND, swelling in lower extremities, anasarca, dizziness, palpitations Resp: No-   shortness of breath with exertion or at rest.              No-   productive cough,  No non-productive cough,  No- coughing up of blood.              No-   change in color of mucus.  No- wheezing.   Skin: No-   rash or lesions. GI:  No-   heartburn, indigestion, abdominal pain, nausea, vomiting,  GU: No-   dysuria,  MS:  No-   joint pain or swelling.   Neuro-     nothing unusual Psych:  No- change in mood or affect. No depression or anxiety.  No memory loss.   OBJ- Physical Exam General- Alert, Oriented, Affect-appropriate, Distress- none acute Skin- rash-none, lesions- none, excoriation- none Lymphadenopathy- none Head- atraumatic            Eyes- Gross vision intact, PERRLA, conjunctivae and secretions clear      Ears- Hearing, canals-normal            Nose- Clear, no-Septal dev, mucus, polyps, erosion, perforation             Throat- Mallampati II , mucosa clear , drainage- none, tonsils- atrophic Neck- flexible , trachea midline, no stridor , thyroid nl, carotid no bruit Chest - symmetrical excursion , unlabored           Heart/CV- RRR , no murmur , no gallop  , no rub, nl s1 s2                           - JVD- none , edema- none, stasis changes- none, varices- none           Lung- clear to P&A, wheeze- none, cough- none , dullness-none, rub- none           Chest wall-  Abd-  Br/ Gen/ Rectal- Not done, not indicated Extrem- cyanosis- none, clubbing, none, atrophy- none, strength- nl Neuro- grossly intact to observation

## 2011-05-04 ENCOUNTER — Telehealth: Payer: Self-pay | Admitting: Internal Medicine

## 2011-05-04 MED ORDER — PREDNISONE 20 MG PO TABS: 20.0000 mg | ORAL_TABLET | Freq: Every day | ORAL | Status: AC

## 2011-05-04 NOTE — Telephone Encounter (Signed)
On Call 05/03/11- Bronchitis worse after OV. Taking biaxin. Increased wheeze. Non-productive.  I called [facility]- prednisone 20 mg daily x 5 days.

## 2011-05-05 NOTE — Assessment & Plan Note (Signed)
Well controlled 

## 2011-05-05 NOTE — Assessment & Plan Note (Signed)
Not seen on exam today. 

## 2011-05-05 NOTE — Assessment & Plan Note (Signed)
Plan prescription for Biaxin to hold. Sample Dymista,  Encourage saline rinse/ Neti pot, refill fluticasone and singulair

## 2011-07-08 ENCOUNTER — Other Ambulatory Visit: Payer: Self-pay | Admitting: Obstetrics and Gynecology

## 2011-07-08 DIAGNOSIS — Z1231 Encounter for screening mammogram for malignant neoplasm of breast: Secondary | ICD-10-CM

## 2011-08-04 ENCOUNTER — Ambulatory Visit (HOSPITAL_COMMUNITY)
Admission: RE | Admit: 2011-08-04 | Discharge: 2011-08-04 | Disposition: A | Discharge status: Home / Self Care | Payer: 59 | Source: Ambulatory Visit | Attending: Obstetrics and Gynecology | Admitting: Obstetrics and Gynecology

## 2011-08-04 DIAGNOSIS — Z1231 Encounter for screening mammogram for malignant neoplasm of breast: Secondary | ICD-10-CM

## 2012-04-07 DIAGNOSIS — G43009 Migraine without aura, not intractable, without status migrainosus: Secondary | ICD-10-CM | POA: Insufficient documentation

## 2012-04-29 ENCOUNTER — Ambulatory Visit (INDEPENDENT_AMBULATORY_CARE_PROVIDER_SITE_OTHER): Payer: 59 | Admitting: Internal Medicine

## 2012-04-29 ENCOUNTER — Encounter: Payer: Self-pay | Admitting: Internal Medicine

## 2012-04-29 VITALS — BP 118/78 | HR 111 | Ht 63.5 in | Wt 156.4 lb

## 2012-04-29 DIAGNOSIS — J45909 Unspecified asthma, uncomplicated: Secondary | ICD-10-CM

## 2012-04-29 DIAGNOSIS — J302 Other seasonal allergic rhinitis: Secondary | ICD-10-CM

## 2012-04-29 DIAGNOSIS — J330 Polyp of nasal cavity: Secondary | ICD-10-CM

## 2012-04-29 DIAGNOSIS — J3089 Other allergic rhinitis: Secondary | ICD-10-CM

## 2012-04-29 DIAGNOSIS — J45998 Other asthma: Secondary | ICD-10-CM

## 2012-04-29 DIAGNOSIS — J309 Allergic rhinitis, unspecified: Secondary | ICD-10-CM

## 2012-04-29 MED ORDER — MONTELUKAST SODIUM 10 MG PO TABS: 10.0000 mg | ORAL_TABLET | Freq: Every day | ORAL | Status: DC

## 2012-04-29 MED ORDER — AZELASTINE HCL 0.1 % NA SOLN: 1.0000 | Freq: Two times a day (BID) | NASAL | Status: DC

## 2012-04-29 MED ORDER — FLUTICASONE PROPIONATE 50 MCG/ACT NA SUSP: 2.0000 | Freq: Every day | NASAL | Status: DC

## 2012-04-29 MED ORDER — IPRATROPIUM BROMIDE HFA 17 MCG/ACT IN AERS: 2.0000 | INHALATION_SPRAY | Freq: Four times a day (QID) | RESPIRATORY_TRACT | Status: DC

## 2012-04-29 NOTE — Progress Notes (Signed)
Patient ID: Sonia [name], female    DOB: 01/29/1956, 57 y.o.   MRN: [id number]  HPI 04/26/09- 21 yo F never smoker (sister Maribelle Hopple w/ severe asthma and rhinitis). Last here for f/u of asthma and allergic rhinitis with hx nasal polyps  . At one year f/u she denies major events. Around eyes she gets some periorbital dermatitis and watery conjunctival irritation. Not taking Singulair due to cost. Using daily allegra and flonase. Rarely wheezed a little in past year- used atrovent rescue inhaler only once.   04/30/11- 62 yo F never smoker (sister Nitika Jackowski w/ severe asthma and rhinitis). Last here for f/u of asthma and allergic rhinitis with hx nasal polyps Has been using Sudafed for popping in her ears, malaise and head congestion. Denies chest tightness or wheeze, reflux or GI upset. Tussionex helps.    04/29/12- 25 yo F never smoker (sister Rajean Desantiago w/ severe asthma and rhinitis). Last here for f/u of asthma and allergic rhinitis with hx nasal polyps FOLLOWS NFA:OZHYQM any SOB, wheezing, cough, or congestion Did pretty well through the winter with no acute respiratory illness, significant wheezing or nasal congestion problems. She has begun noticing nasal stuffiness in the mornings, consistent with spring pollen. Now taking fexofenadine, Flonase and Singulair which we reviewed.  ROS-see HPI Constitutional:   No-   weight loss, night sweats, fevers, chills, fatigue, lassitude. HEENT:   No-  headaches, difficulty swallowing, tooth/dental problems, sore throat,      No-sneezing, itching, ear ache,+ nasal congestion, +post nasal drip,  CV:  No-   chest pain, orthopnea, PND, swelling in lower extremities, anasarca, dizziness, palpitations Resp: No-   shortness of breath with exertion or at rest.              No-   productive cough,  No non-productive cough,  No- coughing up of blood.              No-   change in color of mucus.  No- wheezing.   Skin: No-   rash or lesions. GI:  No-    heartburn, indigestion, abdominal pain, nausea, vomiting,  GU: No-   dysuria,  MS:  No-   joint pain or swelling.   Neuro-     nothing unusual Psych:  No- change in mood or affect. No depression or anxiety.  No memory loss.   OBJ- Physical Exam General- Alert, Oriented, Affect-appropriate, Distress- none acute Skin- rash-none, lesions- none, excoriation- none Lymphadenopathy- none Head- atraumatic            Eyes- Gross vision intact, PERRLA, conjunctivae and secretions clear            Ears- Hearing, canals-normal            Nose- +pale turbinate edema, no-Septal dev, mucus, polyps, erosion, perforation             Throat- Mallampati II , mucosa clear , drainage- none, tonsils- atrophic Neck- flexible , trachea midline, no stridor , thyroid nl, carotid no bruit Chest - symmetrical excursion , unlabored           Heart/CV- RRR , no murmur , no gallop  , no rub, nl s1 s2                           - JVD- none , edema- none, stasis changes- none, varices- none  Lung- clear to P&A, wheeze- none, cough- none , dullness-none, rub- none           Chest wall-  Abd-  Br/ Gen/ Rectal- Not done, not indicated Extrem- cyanosis- none, clubbing, none, atrophy- none, strength- nl Neuro- grossly intact to observation

## 2012-04-29 NOTE — Patient Instructions (Addendum)
Try using the flonase 1-2 puffs each nostril once daily at bedtime  Try carrying the astelin nasal spray and using it as a rescue inhaler if you start sneezing  Script for 1 year supply of singulair/ montelukast to use as discussed.   Refill script for atrovent inhaler

## 2012-05-05 NOTE — Assessment & Plan Note (Addendum)
Singulair may be sufficient to by itself. We discussed rescue inhaler. She doesn't tolerate sympathetic stimulation but we can refill her Atrovent rescue inhaler.

## 2012-05-05 NOTE — Assessment & Plan Note (Signed)
I'm not able to see any polyps at this visit. We discussed nasal steroids.

## 2012-05-05 NOTE — Assessment & Plan Note (Signed)
Mild seasonal increase in nasal congestion consistent with spring pollen affect and we discussed her medications

## 2013-01-16 ENCOUNTER — Encounter: Payer: Self-pay | Admitting: Certified Nurse Midwife

## 2013-01-17 ENCOUNTER — Ambulatory Visit (INDEPENDENT_AMBULATORY_CARE_PROVIDER_SITE_OTHER): Payer: 59 | Admitting: Certified Nurse Midwife

## 2013-01-17 ENCOUNTER — Encounter: Payer: Self-pay | Admitting: Certified Nurse Midwife

## 2013-01-17 VITALS — BP 98/60 | HR 88 | Resp 16 | Ht 63.25 in | Wt 162.0 lb

## 2013-01-17 DIAGNOSIS — Z01419 Encounter for gynecological examination (general) (routine) without abnormal findings: Secondary | ICD-10-CM

## 2013-01-17 DIAGNOSIS — Z0000 Encounter for general adult medical examination without abnormal findings: Secondary | ICD-10-CM

## 2013-01-17 DIAGNOSIS — B009 Herpesviral infection, unspecified: Secondary | ICD-10-CM

## 2013-01-17 MED ORDER — ACYCLOVIR 400 MG PO TABS: 400.0000 mg | ORAL_TABLET | Freq: Every day | ORAL | Status: DC

## 2013-01-17 NOTE — Patient Instructions (Signed)

## 2013-01-17 NOTE — Progress Notes (Signed)
Reviewed personally.  M. Suzanne Lindaann Gradilla, MD.  

## 2013-01-17 NOTE — Progress Notes (Signed)
57 y.o. G0P0000 Single Caucasian Fe here for annual exam.Menopausal no HRT. Denies vaginal bleeding or vaginal dryness. Has had a good year. Diabetes stable on insulin. Sees endocrine for management. Sees PCP for aex and hypertension management, stable and labs. No HSV outbreaks on daily Acyclovir, needs refill. No health issues today.  Patient's last menstrual period was 01/02/2001.          Sexually active: yes  The current method of family planning is status post hysterectomy.    Exercising: yes  walking Smoker:  no  Health Maintenance: Pap:  01-15-12 neg HPV HR neg MMG:  08-04-11 normal given information to schedule Colonoscopy:  2006 BMD:   2010 TDaP:  2008 Labs: none Self breast exam: done occ   reports that she has never smoked. She does not have any smokeless tobacco history on file. She reports that she does not drink alcohol or use illicit drugs.  Past Medical History  Diagnosis Date  . Polyp of nasal cavity   . Allergic rhinitis, cause unspecified   . Unspecified asthma(493.90)   . Fibroid   . Hypertension   . STD (sexually transmitted disease)     HSV2  . Diabetes mellitus without complication     on insulin  . Abnormal Pap smear 1990  . Asthma   . Anxiety     Past Surgical History  Procedure Laterality Date  . Tonsillectomy    . Rotator cuff repair  2006 left shoulder  . Cryotherapy  1990    no HPV changes, no dysplasia, chronic cervicitis  . Laparoscopic ovarian cystectomy      bilateral 2004  . Supracervical abdominal hysterectomy  2002    cervix & ovaries retained    Current Outpatient Prescriptions  Medication Sig Dispense Refill  . acyclovir (ZOVIRAX) 400 MG tablet Take 400 mg by mouth daily.      . fexofenadine (ALLEGRA) 180 MG tablet Take 180 mg by mouth daily.        . fluticasone (FLONASE) 50 MCG/ACT nasal spray Place 2 sprays into the nose daily.  48 g  prn  . HUMALOG KWIKPEN 100 UNIT/ML injection Take as directed      . insulin glargine  (LANTUS) 100 units/mL SOLN Inject into the skin daily. 20 units      . ipratropium (ATROVENT HFA) 17 MCG/ACT inhaler Inhale 2 puffs into the lungs every 6 (six) hours. As needed  1 Inhaler  prn  . lisinopril (PRINIVIL,ZESTRIL) 10 MG tablet Take 10 mg by mouth daily.        . metFORMIN (GLUCOPHAGE) 1000 MG tablet Take 1,000 mg by mouth 2 (two) times daily.        . montelukast (SINGULAIR) 10 MG tablet Take 1 tablet (10 mg total) by mouth daily.  360 tablet  prn  . naratriptan (AMERGE) 2.5 MG tablet Take 2.5 mg by mouth as needed. Take one (1) tablet at onset of headache; if returns or does not resolve, may repeat after 4 hours; do not exceed five (5) mg in 24 hours.       Marland Kitchen NEXIUM 40 MG capsule Take 1 tablet by mouth daily.      Marland Kitchen olopatadine (PATANOL) 0.1 % ophthalmic solution AS NEEDED       . simvastatin (ZOCOR) 20 MG tablet Take 20 mg by mouth daily.         No current facility-administered medications for this visit.    Family History  Problem Relation Age of Onset  .  Asthma Sister     SEVERE CHRONIC  . Cancer Mother     bile duct liver cancer  . Thyroid disease Mother   . Cancer Father     leukemia  . Cancer Maternal Grandmother     stomach that started around rectum  . Heart attack Maternal Grandfather   . Stroke Paternal Grandmother   . Cancer Paternal Grandfather     started in kidneys    ROS:  Pertinent items are noted in HPI.  Otherwise, a comprehensive ROS was negative.  Exam:   BP 98/60  Pulse 88  Resp 16  Ht 5' 3.25" (1.607 m)  Wt 162 lb (73.483 kg)  BMI 28.45 kg/m2  LMP 01/02/2001 Height: 5' 3.25" (160.7 cm)  Ht Readings from Last 3 Encounters:  01/17/13 5' 3.25" (1.607 m)  04/29/12 5' 3.5" (1.613 m)  04/30/11 5' 3.5" (1.613 m)    General appearance: alert, cooperative and appears stated age Head: Normocephalic, without obvious abnormality, atraumatic Neck: no adenopathy, supple, symmetrical, trachea midline and thyroid normal to inspection and palpation  and non-palpable Lungs: clear to auscultation bilaterally Breasts: normal appearance, no masses or tenderness, No nipple retraction or dimpling, No nipple discharge or bleeding, No axillary or supraclavicular adenopathy Heart: regular rate and rhythm Abdomen: soft, non-tender; no masses,  no organomegaly Extremities: extremities normal, atraumatic, no cyanosis or edema Skin: Skin color, texture, turgor normal. No rashes or lesions Lymph nodes: Cervical, supraclavicular, and axillary nodes normal. No abnormal inguinal nodes palpated Neurologic: Grossly normal   Pelvic: External genitalia:  no lesions              Urethra:  normal appearing urethra with no masses, tenderness or lesions              Bartholin's and Skene's: normal                 Vagina: normal appearing vagina with normal color and discharge, no lesions              Cervix: normal, non tender              Pap taken: no Bimanual Exam:  Uterus:  uterus absent              Adnexa: normal adnexa and no mass, fullness, tenderness               Rectovaginal: Confirms               Anus:  normal sphincter tone, no lesions  A:  Well Woman with normal exam  Menopausal s/p Supracervical abdominal hyst. With ovaries retained-fibroids  AODM stable with Endocrine management  Hypertension stable with PCP management   P:   Reviewed health and wellness pertinent to exam  Aware of need to evaluate if vaginal bleeding.  Continue follow up as indicated  IFOB dispensed  Pap smear as per guidelines   Mammogram yearly, information given to schedule pap smear not taken counseled on breast self exam, mammography screening, adequate intake of calcium and vitamin D, diet and exercise, Kegel's exercises  return annually or prn  An After Visit Summary was printed and given to the patient.

## 2013-05-30 ENCOUNTER — Telehealth: Payer: Self-pay

## 2013-05-30 NOTE — Telephone Encounter (Signed)
Spoke to patient & reminded her to turn in her ifob. Pt said ok. Routed to D. Hollice Espy Encounter closed

## 2013-05-30 NOTE — Telephone Encounter (Signed)
Message copied by Susy Manor on Tue May 30, 2013  2:00 PM ------      Message from: Kem Boroughs R      Created: Fri May 26, 2013 12:52 PM       Please put in 2 months recall from date and if no response please call      ----- Message -----         From: SYSTEM         Sent: 01/22/2013  12:09 AM           To: Regina Eck, CNM                   ------

## 2013-05-30 NOTE — Telephone Encounter (Signed)
Called patient to remind her to return ifob. lmtcb

## 2013-06-20 ENCOUNTER — Ambulatory Visit (INDEPENDENT_AMBULATORY_CARE_PROVIDER_SITE_OTHER): Payer: 59 | Admitting: Internal Medicine

## 2013-06-20 ENCOUNTER — Encounter: Payer: Self-pay | Admitting: Internal Medicine

## 2013-06-20 VITALS — BP 120/64 | HR 86 | Ht 63.5 in | Wt 159.8 lb

## 2013-06-20 DIAGNOSIS — J302 Other seasonal allergic rhinitis: Secondary | ICD-10-CM

## 2013-06-20 DIAGNOSIS — J45998 Other asthma: Secondary | ICD-10-CM

## 2013-06-20 DIAGNOSIS — J45909 Unspecified asthma, uncomplicated: Secondary | ICD-10-CM

## 2013-06-20 DIAGNOSIS — J309 Allergic rhinitis, unspecified: Secondary | ICD-10-CM

## 2013-06-20 DIAGNOSIS — J3089 Other allergic rhinitis: Secondary | ICD-10-CM

## 2013-06-20 DIAGNOSIS — J330 Polyp of nasal cavity: Secondary | ICD-10-CM

## 2013-06-20 MED ORDER — MONTELUKAST SODIUM 10 MG PO TABS: 10.0000 mg | ORAL_TABLET | Freq: Every day | ORAL | Status: DC

## 2013-06-20 MED ORDER — IPRATROPIUM BROMIDE HFA 17 MCG/ACT IN AERS: 2.0000 | INHALATION_SPRAY | Freq: Four times a day (QID) | RESPIRATORY_TRACT | Status: DC

## 2013-06-20 NOTE — Progress Notes (Signed)
Patient ID: Sonia [name], female    DOB: [birth year], 58 y.o.   MRN: [id number]  HPI 04/26/09- 68 yo F never smoker (sister Breann Losano w/ severe asthma and rhinitis). Last here for f/u of asthma and allergic rhinitis with hx nasal polyps  . At one year f/u she denies major events. Around eyes she gets some periorbital dermatitis and watery conjunctival irritation. Not taking Singulair due to cost. Using daily allegra and flonase. Rarely wheezed a little in past year- used atrovent rescue inhaler only once.   04/30/11- 43 yo F never smoker (sister Fabian Coca w/ severe asthma and rhinitis). Last here for f/u of asthma and allergic rhinitis with hx nasal polyps Has been using Sudafed for popping in her ears, malaise and head congestion. Denies chest tightness or wheeze, reflux or GI upset. Tussionex helps.    04/29/12- 18 yo F never smoker (sister Sheneika Walstad w/ severe asthma and rhinitis). Last here for f/u of asthma and allergic rhinitis with hx nasal polyps FOLLOWS JOA:CZYSAY any SOB, wheezing, cough, or congestion Did pretty well through the winter with no acute respiratory illness, significant wheezing or nasal congestion problems. She has begun noticing nasal stuffiness in the mornings, consistent with spring pollen. Now taking fexofenadine, Flonase and Singulair which we reviewed.  06/20/13- 34 yo F never smoker (sister Mishka Stegemann w/ severe asthma and rhinitis). Last here for f/u of asthma and allergic rhinitis with hx nasal polyps FOLLOWS FOR: has had increased congeston due to pollen; otherwise doing well overall. Minor nasal stuffiness with spring pollen but otherwise doing very well. Continues Flonase, Singulair, Allegra. Rarely uses Atrovent inhaler. Little chest tightness or wheeze.  ROS-see HPI Constitutional:   No-   weight loss, night sweats, fevers, chills, fatigue, lassitude. HEENT:   No-  headaches, difficulty swallowing, tooth/dental problems, sore throat,      No-sneezing,  itching, ear ache,+ nasal congestion, +post nasal drip,  CV:  No-   chest pain, orthopnea, PND, swelling in lower extremities, anasarca, dizziness, palpitations Resp: No-   shortness of breath with exertion or at rest.              No-   productive cough,  No non-productive cough,  No- coughing up of blood.              No-   change in color of mucus.  No- wheezing.   Skin: No-   rash or lesions. GI:  No-   heartburn, indigestion, abdominal pain, nausea, vomiting,  GU: No-   dysuria,  MS:  No-   joint pain or swelling.   Neuro-     nothing unusual Psych:  No- change in mood or affect. No depression or anxiety.  No memory loss.   OBJ- Physical Exam General- Alert, Oriented, Affect-appropriate, Distress- none acute Skin- rash-none, lesions- none, excoriation- none Lymphadenopathy- none Head- atraumatic            Eyes- Gross vision intact, PERRLA, conjunctivae and secretions clear            Ears- Hearing, canals-normal            Nose- +pale turbinate edema, no-Septal dev, mucus, polyps, erosion, perforation             Throat- Mallampati II , mucosa clear , drainage- none, tonsils- atrophic Neck- flexible , trachea midline, no stridor , thyroid nl, carotid no bruit Chest - symmetrical excursion , unlabored  Heart/CV- RRR , no murmur , no gallop  , no rub, nl s1 s2                           - JVD- none , edema- none, stasis changes- none, varices- none           Lung- clear to P&A, wheeze- none, cough- none , dullness-none, rub- none           Chest wall-  Abd-  Br/ Gen/ Rectal- Not done, not indicated Extrem- cyanosis- none, clubbing, none, atrophy- none, strength- nl Neuro- grossly intact to observation

## 2013-06-20 NOTE — Patient Instructions (Signed)
Script for Singulair sent to [facility] for Atrovent/ ipratropium sent to [facility]  Please call as needed

## 2013-06-23 ENCOUNTER — Other Ambulatory Visit: Payer: Self-pay | Admitting: Internal Medicine

## 2013-08-05 NOTE — Assessment & Plan Note (Signed)
Adequate control. Plan-refill Singulair

## 2013-08-05 NOTE — Assessment & Plan Note (Signed)
Not seen on today's exam

## 2013-08-05 NOTE — Assessment & Plan Note (Signed)
Excellent control Plan-refill Singulair and Atrovent inhaler to keep available

## 2013-12-13 ENCOUNTER — Other Ambulatory Visit: Payer: Self-pay

## 2013-12-13 DIAGNOSIS — Z1231 Encounter for screening mammogram for malignant neoplasm of breast: Secondary | ICD-10-CM

## 2013-12-27 ENCOUNTER — Ambulatory Visit
Admission: RE | Admit: 2013-12-27 | Discharge: 2013-12-27 | Disposition: A | Discharge status: Home / Self Care | Payer: 59 | Source: Ambulatory Visit

## 2013-12-27 DIAGNOSIS — Z1231 Encounter for screening mammogram for malignant neoplasm of breast: Secondary | ICD-10-CM

## 2014-01-18 ENCOUNTER — Ambulatory Visit (INDEPENDENT_AMBULATORY_CARE_PROVIDER_SITE_OTHER): Payer: 59 | Admitting: Certified Nurse Midwife

## 2014-01-18 ENCOUNTER — Encounter: Payer: Self-pay | Admitting: Certified Nurse Midwife

## 2014-01-18 VITALS — BP 104/60 | HR 72 | Ht 63.25 in | Wt 136.0 lb

## 2014-01-18 DIAGNOSIS — Z124 Encounter for screening for malignant neoplasm of cervix: Secondary | ICD-10-CM

## 2014-01-18 DIAGNOSIS — Z01419 Encounter for gynecological examination (general) (routine) without abnormal findings: Secondary | ICD-10-CM

## 2014-01-18 NOTE — Progress Notes (Signed)
58 y.o. G0P0000 Single Caucasian Fe here for annual exam. Menopausal no HRT. Denies vaginal bleeding or vaginal dryness. Has been working on weight loss since 7/15 with 26 pound weight loss. So excited about weight loss. No HSV outbreaks with Acyclovir. Sees PCP for aex/ glucose and hypertension management. All medications are stable. No health issues today. Looking at buying a new truck!  Patient's last menstrual period was 01/02/2001.          Sexually active: Yes.    The current method of family planning is status post hysterectomy.    Exercising: Yes.    Home exercise routine includes walking. Smoker:  no  Health Maintenance: Pap:  01/15/12 neg HPV HR neg MMG:  12/27/13 Bi-Rads neg Colonoscopy:  2007 negative BMD:   2015 with PCP management TDaP:  2008 Labs: PCP              UA: PCP   reports that she has never smoked. She does not have any smokeless tobacco history on file. She reports that she does not drink alcohol or use illicit drugs.  Past Medical History  Diagnosis Date  . Polyp of nasal cavity   . Allergic rhinitis, cause unspecified   . Unspecified asthma(493.90)   . Fibroid   . Hypertension   . STD (sexually transmitted disease)     HSV2  . Diabetes mellitus without complication     on insulin  . Abnormal Pap smear 1990  . Asthma   . Anxiety     Past Surgical History  Procedure Laterality Date  . Tonsillectomy    . Rotator cuff repair  2006 left shoulder  . Cryotherapy  1990    no HPV changes, no dysplasia, chronic cervicitis  . Laparoscopic ovarian cystectomy      bilateral 2004  . Supracervical abdominal hysterectomy  2002    cervix & ovaries retained    Current Outpatient Prescriptions  Medication Sig Dispense Refill  . acyclovir (ZOVIRAX) 400 MG tablet Take 1 tablet (400 mg total) by mouth daily. 30 tablet 12  . fexofenadine (ALLEGRA) 180 MG tablet Take 180 mg by mouth daily.      . fluticasone (FLONASE) 50 MCG/ACT nasal spray INSTILL 2 SPRAYS IN  EACH NOSTRIL EVERY DAY 16 g 4  . HUMALOG KWIKPEN 100 UNIT/ML injection Take as directed    . insulin glargine (LANTUS) 100 units/mL SOLN Inject into the skin daily. 20 units    . ipratropium (ATROVENT HFA) 17 MCG/ACT inhaler Inhale 2 puffs into the lungs every 6 (six) hours. As needed 1 Inhaler prn  . lisinopril (PRINIVIL,ZESTRIL) 10 MG tablet Take 10 mg by mouth daily.      . metFORMIN (GLUCOPHAGE) 1000 MG tablet Take 1,000 mg by mouth 2 (two) times daily.      . montelukast (SINGULAIR) 10 MG tablet Take 1 tablet (10 mg total) by mouth daily. 360 tablet prn  . naphazoline (NAPHCON) 0.1 % ophthalmic solution Place 1 drop into both eyes as needed for irritation.    . naratriptan (AMERGE) 2.5 MG tablet Take 2.5 mg by mouth as needed. Take one (1) tablet at onset of headache; if returns or does not resolve, may repeat after 4 hours; do not exceed five (5) mg in 24 hours.     Marland Kitchen NEXIUM 40 MG capsule Take 1 tablet by mouth daily.    . simvastatin (ZOCOR) 20 MG tablet Take 20 mg by mouth daily.       No current facility-administered  medications for this visit.    Family History  Problem Relation Age of Onset  . Asthma Sister     SEVERE CHRONIC  . Cancer Mother     bile duct liver cancer  . Thyroid disease Mother   . Cancer Father     leukemia  . Cancer Maternal Grandmother     stomach that started around rectum  . Heart attack Maternal Grandfather   . Stroke Paternal Grandmother   . Cancer Paternal Grandfather     started in kidneys    ROS:  Pertinent items are noted in HPI.  Otherwise, a comprehensive ROS was negative.  Exam:   BP 104/60 mmHg  Pulse 72  Ht 5' 3.25" (1.607 m)  Wt 136 lb (61.689 kg)  BMI 23.89 kg/m2  LMP 01/02/2001 Height: 5' 3.25" (160.7 cm)  Ht Readings from Last 3 Encounters:  01/18/14 5' 3.25" (1.607 m)  06/20/13 5' 3.5" (1.613 m)  01/17/13 5' 3.25" (1.607 m)    General appearance: alert, cooperative and appears stated age Head: Normocephalic, without  obvious abnormality, atraumatic Neck: no adenopathy, supple, symmetrical, trachea midline and thyroid normal to inspection and palpation Lungs: clear to auscultation bilaterally Breasts: normal appearance, no masses or tenderness, No nipple retraction or dimpling, No nipple discharge or bleeding, No axillary or supraclavicular adenopathy Heart: regular rate and rhythm Abdomen: soft, non-tender; no masses,  no organomegaly Extremities: extremities normal, atraumatic, no cyanosis or edema Skin: Skin color, texture, turgor normal. No rashes or lesions Lymph nodes: Cervical, supraclavicular, and axillary nodes normal. No abnormal inguinal nodes palpated Neurologic: Grossly normal   Pelvic: External genitalia:  no lesions              Urethra:  normal appearing urethra with no masses, tenderness or lesions              Bartholin's and Skene's: normal                 Vagina: normal appearing vagina with normal color and discharge, no lesions              Cervix: normal,non tender, no lesions              Pap taken: Yes.   Bimanual Exam:  Uterus:  uterus absent              Adnexa: normal adnexa and no mass, fullness, tenderness               Rectovaginal: Confirms               Anus:  normal sphincter tone, no lesions  A:  Well Woman with normal exam  Menopausal no HRT s/p supracervical hysterectomy with ovaries retained  Type 1 Diabetes well controlled, Hypertension/cholesterol medications stable with PCP management.  P:   Reviewed health and wellness pertinent to exam  Aware of need to advise if vaginal bleeding  Continue follow up as indicated  Pap smear taken today with HPV reflex   counseled on breast self exam, mammography screening, menopause, adequate intake of calcium and vitamin D, diet and exercise  return annually or prn  An After Visit Summary was printed and given to the patient.

## 2014-01-18 NOTE — Patient Instructions (Signed)

## 2014-01-19 NOTE — Progress Notes (Signed)
Reviewed personally.  M. Suzanne Azula Zappia, MD.  

## 2014-01-22 LAB — IPS PAP TEST WITH REFLEX TO HPV

## 2014-01-24 ENCOUNTER — Other Ambulatory Visit: Payer: Self-pay | Admitting: Certified Nurse Midwife

## 2014-01-27 ENCOUNTER — Other Ambulatory Visit: Payer: Self-pay | Admitting: Certified Nurse Midwife

## 2014-01-29 ENCOUNTER — Other Ambulatory Visit: Payer: Self-pay

## 2014-01-29 NOTE — Telephone Encounter (Signed)
Please disregard. Rx already sent to provider

## 2014-01-29 NOTE — Telephone Encounter (Signed)
Medication refill request: Acyclovir 400 mg  Last AEX:  01/18/14 with Ms. Debbie Next AEX:  01/10/15 with Ms. Debbie  Last MMG (if hormonal medication request): N/A Refill authorized: #[id number] rfs, please advise.  Routed to [facility] since DL is out of office today.

## 2014-06-21 ENCOUNTER — Encounter: Payer: Self-pay | Admitting: Internal Medicine

## 2014-06-21 ENCOUNTER — Ambulatory Visit (INDEPENDENT_AMBULATORY_CARE_PROVIDER_SITE_OTHER): Payer: 59 | Admitting: Internal Medicine

## 2014-06-21 VITALS — BP 114/74 | HR 84 | Ht 63.5 in | Wt 134.8 lb

## 2014-06-21 DIAGNOSIS — J45998 Other asthma: Secondary | ICD-10-CM

## 2014-06-21 DIAGNOSIS — J302 Other seasonal allergic rhinitis: Secondary | ICD-10-CM

## 2014-06-21 DIAGNOSIS — J309 Allergic rhinitis, unspecified: Secondary | ICD-10-CM | POA: Diagnosis not present

## 2014-06-21 DIAGNOSIS — J3089 Other allergic rhinitis: Secondary | ICD-10-CM

## 2014-06-21 MED ORDER — IPRATROPIUM BROMIDE HFA 17 MCG/ACT IN AERS: 2.0000 | INHALATION_SPRAY | Freq: Four times a day (QID) | RESPIRATORY_TRACT | Status: DC

## 2014-06-21 MED ORDER — MONTELUKAST SODIUM 10 MG PO TABS: 10.0000 mg | ORAL_TABLET | Freq: Every day | ORAL | Status: DC

## 2014-06-21 NOTE — Patient Instructions (Signed)
Script printed for Atrovent inhaler refill  Script sent for 1 year supply of Singulair / montelukast  Please call if we can help

## 2014-06-21 NOTE — Progress Notes (Signed)
Patient ID: Sonia [name], female    DOB: 1955-09-11, 59 y.o.   MRN: [id number]  HPI 04/26/09- 57 yo F never smoker (sister Samya Siciliano w/ severe asthma and rhinitis). Last here for f/u of asthma and allergic rhinitis with hx nasal polyps  . At one year f/u she denies major events. Around eyes she gets some periorbital dermatitis and watery conjunctival irritation. Not taking Singulair due to cost. Using daily allegra and flonase. Rarely wheezed a little in past year- used atrovent rescue inhaler only once.   04/30/11- 12 yo F never smoker (sister Maicie Vanderloop w/ severe asthma and rhinitis). Last here for f/u of asthma and allergic rhinitis with hx nasal polyps Has been using Sudafed for popping in her ears, malaise and head congestion. Denies chest tightness or wheeze, reflux or GI upset. Tussionex helps.    04/29/12- 43 yo F never smoker (sister Alcie Runions w/ severe asthma and rhinitis). Last here for f/u of asthma and allergic rhinitis with hx nasal polyps FOLLOWS GGE:ZMOQHU any SOB, wheezing, cough, or congestion Did pretty well through the winter with no acute respiratory illness, significant wheezing or nasal congestion problems. She has begun noticing nasal stuffiness in the mornings, consistent with spring pollen. Now taking fexofenadine, Flonase and Singulair which we reviewed.  06/20/13- 68 yo F never smoker (sister Rudean Icenhour w/ severe asthma and rhinitis). Last here for f/u of asthma and allergic rhinitis with hx nasal polyps FOLLOWS FOR: has had increased congeston due to pollen; otherwise doing well overall. Minor nasal stuffiness with spring pollen but otherwise doing very well. Continues Flonase, Singulair, Allegra. Rarely uses Atrovent inhaler. Little chest tightness or wheeze.  06/21/14- 69 yo F never smoker (sister Jatoya Armbrister w/ severe asthma and rhinitis) for f/u of asthma and allergic rhinitis with hx nasal polyps FOLLOWS FOR: Denies any troubles at this time; no wheezing,  SOB, cough, or congestion. Comfortable with OTC medication for nasal congestion during the spring season.  ROS-see HPI Constitutional:   No-   weight loss, night sweats, fevers, chills, fatigue, lassitude. HEENT:   No-  headaches, difficulty swallowing, tooth/dental problems, sore throat,      No-sneezing, itching, ear ache,+ nasal congestion, +post nasal drip,  CV:  No-   chest pain, orthopnea, PND, swelling in lower extremities, anasarca, dizziness, palpitations Resp: No-   shortness of breath with exertion or at rest.              No-   productive cough,  No non-productive cough,  No- coughing up of blood.              No-   change in color of mucus.  No- wheezing.   Skin: No-   rash or lesions. GI:  No-   heartburn, indigestion, abdominal pain, nausea, vomiting,  GU:  MS:  No-   joint pain or swelling.   Neuro-     nothing unusual Psych:  No- change in mood or affect. No depression or anxiety.  No memory loss.   OBJ- Physical Exam General- Alert, Oriented, Affect-appropriate, Distress- none acute Skin- rash-none, lesions- none, excoriation- none Lymphadenopathy- none Head- atraumatic            Eyes- Gross vision intact, PERRLA, conjunctivae and secretions clear            Ears- Hearing, canals-normal            Nose- +pale turbinate edema, no-Septal dev, mucus, polyps, erosion, perforation  Throat- Mallampati II , mucosa clear , drainage- none, tonsils- atrophic Neck- flexible , trachea midline, no stridor , thyroid nl, carotid no bruit Chest - symmetrical excursion , unlabored           Heart/CV- RRR , no murmur , no gallop  , no rub, nl s1 s2                           - JVD- none , edema- none, stasis changes- none, varices- none           Lung- clear to P&A, wheeze- none, cough- none , dullness-none, rub- none           Chest wall-  Abd-  Br/ Gen/ Rectal- Not done, not indicated Extrem- cyanosis- none, clubbing, none, atrophy- none, strength- nl Neuro- grossly  intact to observation

## 2014-07-08 NOTE — Assessment & Plan Note (Signed)
Has done well through spring pollen season with current meds supplemented OTC when needed.

## 2014-07-08 NOTE — Assessment & Plan Note (Signed)
Mild intermittent well-controlled. Plan-refill Atrovent inhaler, Singulair

## 2015-01-10 ENCOUNTER — Ambulatory Visit: Payer: 59 | Admitting: Certified Nurse Midwife

## 2015-02-05 ENCOUNTER — Ambulatory Visit: Payer: Self-pay | Admitting: Certified Nurse Midwife

## 2015-02-10 ENCOUNTER — Other Ambulatory Visit: Payer: Self-pay | Admitting: Nurse Practitioner

## 2015-02-11 NOTE — Telephone Encounter (Signed)
Medication refill request: acyclovir Last AEX:  01-18-14 Next AEX: not scheduled- patient cancelled her appt last week  Last MMG (if hormonal medication request): 12-27-13 WNL Refill authorized: please advise   Sending to BS since DL is off today-eh

## 2015-03-13 ENCOUNTER — Ambulatory Visit: Payer: Self-pay | Admitting: Certified Nurse Midwife

## 2015-03-14 ENCOUNTER — Ambulatory Visit (INDEPENDENT_AMBULATORY_CARE_PROVIDER_SITE_OTHER): Payer: Managed Care, Other (non HMO) | Admitting: Certified Nurse Midwife

## 2015-03-14 ENCOUNTER — Encounter: Payer: Self-pay | Admitting: Certified Nurse Midwife

## 2015-03-14 VITALS — BP 94/60 | HR 68 | Resp 16 | Ht 63.25 in | Wt 144.0 lb

## 2015-03-14 DIAGNOSIS — Z124 Encounter for screening for malignant neoplasm of cervix: Secondary | ICD-10-CM | POA: Diagnosis not present

## 2015-03-14 DIAGNOSIS — Z0000 Encounter for general adult medical examination without abnormal findings: Secondary | ICD-10-CM

## 2015-03-14 DIAGNOSIS — Z01419 Encounter for gynecological examination (general) (routine) without abnormal findings: Secondary | ICD-10-CM

## 2015-03-14 LAB — POCT URINALYSIS DIPSTICK
Bilirubin, UA: NEGATIVE
Blood, UA: NEGATIVE
Glucose, UA: NEGATIVE
Ketones, UA: NEGATIVE
Leukocytes, UA: NEGATIVE
Nitrite, UA: NEGATIVE
Protein, UA: NEGATIVE
Urobilinogen, UA: NEGATIVE
pH, UA: 5

## 2015-03-14 NOTE — Patient Instructions (Signed)

## 2015-03-14 NOTE — Progress Notes (Signed)
60 y.o. G0P0000 Single  Caucasian Fe here for annual exam. Menopausal no HRT. Denies vaginal bleeding or vaginal dryness. Sees PCP for aex/labs and medication management of cholesterol/diabetes/hypertension which are all stable. No health issues today.   Patient's last menstrual period was 01/02/2001.          Sexually active: Yes.    The current method of family planning is hysterectomy (supracervical).    Exercising: Yes.    walk Smoker:  no  Health Maintenance: Pap:  01-18-14 neg MMG:  12-27-13 category c density,birads 1:neg Colonoscopy:  2007 neg BMD:   2015 pcp manages TDaP:  2008 Shingles: no Pneumonia: 2015 Hep C and HIV: not done Labs: poct urine-neg Self breast exam: done occ   reports that she has never smoked. She does not have any smokeless tobacco history on file. She reports that she does not drink alcohol or use illicit drugs.  Past Medical History  Diagnosis Date  . Polyp of nasal cavity   . Allergic rhinitis, cause unspecified   . Unspecified asthma(493.90)   . Fibroid   . Hypertension   . STD (sexually transmitted disease)     HSV2  . Diabetes mellitus without complication ([place])     on insulin  . Abnormal Pap smear 1990  . Asthma   . Anxiety     Past Surgical History  Procedure Laterality Date  . Tonsillectomy    . Rotator cuff repair  2006 left shoulder  . Cryotherapy  1990    no HPV changes, no dysplasia, chronic cervicitis  . Laparoscopic ovarian cystectomy      bilateral 2004  . Supracervical abdominal hysterectomy  2002    cervix & ovaries retained    Current Outpatient Prescriptions  Medication Sig Dispense Refill  . acyclovir (ZOVIRAX) 400 MG tablet TAKE 1 TABLET (400 MG TOTAL) BY MOUTH DAILY. 30 tablet 0  . fexofenadine (ALLEGRA) 180 MG tablet Take 180 mg by mouth daily.      . fluticasone (FLONASE) 50 MCG/ACT nasal spray INSTILL 2 SPRAYS IN EACH NOSTRIL EVERY DAY 16 g 4  . HUMALOG KWIKPEN 100 UNIT/ML injection Take as directed    .  insulin glargine (LANTUS) 100 units/mL SOLN Inject into the skin daily. 20 units    . ipratropium (ATROVENT HFA) 17 MCG/ACT inhaler Inhale 2 puffs into the lungs every 6 (six) hours. As needed 1 Inhaler prn  . lisinopril (PRINIVIL,ZESTRIL) 10 MG tablet Take 10 mg by mouth daily.      . metFORMIN (GLUCOPHAGE) 1000 MG tablet Take 1,000 mg by mouth 2 (two) times daily.      . montelukast (SINGULAIR) 10 MG tablet Take 1 tablet (10 mg total) by mouth daily. 360 tablet prn  . naphazoline (NAPHCON) 0.1 % ophthalmic solution Place 1 drop into both eyes as needed for irritation.    . naratriptan (AMERGE) 2.5 MG tablet Take 2.5 mg by mouth as needed. Take one (1) tablet at onset of headache; if returns or does not resolve, may repeat after 4 hours; do not exceed five (5) mg in 24 hours.     Marland Kitchen NEXIUM 40 MG capsule Take 1 tablet by mouth daily.    . simvastatin (ZOCOR) 20 MG tablet Take 20 mg by mouth daily.       No current facility-administered medications for this visit.    Family History  Problem Relation Age of Onset  . Asthma Sister     SEVERE CHRONIC  . Cancer Mother  bile duct liver cancer  . Thyroid disease Mother   . Cancer Father     leukemia  . Cancer Maternal Grandmother     stomach that started around rectum  . Heart attack Maternal Grandfather   . Stroke Paternal Grandmother   . Cancer Paternal Grandfather     started in kidneys    ROS:  Pertinent items are noted in HPI.  Otherwise, a comprehensive ROS was negative.  Exam:   BP 94/60 mmHg  Pulse 68  Resp 16  Ht 5' 3.25" (1.607 m)  Wt 144 lb (65.318 kg)  BMI 25.29 kg/m2  LMP 01/02/2001 Height: 5' 3.25" (160.7 cm) Ht Readings from Last 3 Encounters:  03/14/15 5' 3.25" (1.607 m)  06/21/14 5' 3.5" (1.613 m)  01/18/14 5' 3.25" (1.607 m)    General appearance: alert, cooperative and appears stated age Head: Normocephalic, without obvious abnormality, atraumatic Neck: no adenopathy, supple, symmetrical, trachea midline  and thyroid normal to inspection and palpation Lungs: clear to auscultation bilaterally Breasts: normal appearance, no masses or tenderness, No nipple retraction or dimpling, No nipple discharge or bleeding, No axillary or supraclavicular adenopathy Heart: regular rate and rhythm Abdomen: soft, non-tender; no masses,  no organomegaly Extremities: extremities normal, atraumatic, no cyanosis or edema Skin: Skin color, texture, turgor normal. No rashes or lesions Lymph nodes: Cervical, supraclavicular, and axillary nodes normal. No abnormal inguinal nodes palpated Neurologic: Grossly normal   Pelvic: External genitalia:  no lesions              Urethra:  normal appearing urethra with no masses, tenderness or lesions              Bartholin's and Skene's: normal                 Vagina: normal appearing vagina with normal color and discharge, no lesions              Cervix: normal, non tenderness              Pap taken: Yes.   Bimanual Exam:  Uterus:  normal size, contour, position, consistency, mobility, non-tender              Adnexa: normal adnexa and no mass, fullness, tenderness               Rectovaginal: Confirms               Anus:  normal sphincter tone, no lesions  Chaperone present: yes  A:  Well Woman with normal exam  Menopausal no HRT s/p TAH with ovaries retained, supracervical  Hypertension/cholesterol/diabetes with PCP management  Colonoscopy and mammogram due  P:   Reviewed health and wellness pertinent to exam  Aware of need to evaluate if vaginal bleeding  Continue follow up with PCP as indicated  Pap smear as above with HPV reflex  Discussed importance of mammogram yearly and need to schedule. Patient plans to do today.  Discussed colonoscopy due, patient will call when ready to schedule for referral to Dr. Collene Mares.   Discussed importance of SBE, calcium, Vitamin D, regular exercise and healthy diet. Questions addressed.   return annually or prn  An After Visit  Summary was printed and given to the patient.

## 2015-03-15 NOTE — Progress Notes (Signed)
Encounter reviewed Brentton Wardlow, MD   

## 2015-03-19 LAB — IPS PAP TEST WITH HPV

## 2015-04-01 ENCOUNTER — Telehealth: Payer: Self-pay | Admitting: Internal Medicine

## 2015-04-01 MED ORDER — AZITHROMYCIN 250 MG PO TABS: ORAL_TABLET | ORAL | Status: DC

## 2015-04-01 NOTE — Telephone Encounter (Signed)
Spoke with pt, aware of results/recs.  rx sent to preferred pharmacy.  Nothing further needed.  

## 2015-04-01 NOTE — Telephone Encounter (Signed)
Ok to send Z pak 

## 2015-04-01 NOTE — Telephone Encounter (Signed)
Spoke with pt, c/o PND, scratchy throat, wheezing, SOB, unknown if she has fever, headache, sometimes prod cough with clear mucus but states it has a foul taste to it X2 days.  Pt has been taking tussionex to help with s/s-but can only take a tiny amount as this causes her headache to worsen.  Pt would like a zpak if possible.   [facility] uses [facility]  Last ov: 06/2014 Next ov: 06/27/15  CY please advise.  Thanks!   Allergies  Allergen Reactions  . Ciprofloxacin   . Doxycycline     Nausea vomiting  . Flagyl [Metronidazole]     GI intolerance  . Penicillins   . Amoxicillin Swelling and Rash

## 2015-05-12 ENCOUNTER — Other Ambulatory Visit: Payer: Self-pay | Admitting: Obstetrics and Gynecology

## 2015-05-13 NOTE — Telephone Encounter (Signed)
Medication refill request: acyclovir 400mg  Last AEX:  03/14/15 DL Next AEX: 03/17/16 Last MMG (if hormonal medication request): 01/01/14 Birads1 neg Refill authorized: 02/11/15 #30 [name] today please advise

## 2015-06-27 ENCOUNTER — Encounter: Payer: Self-pay | Admitting: Internal Medicine

## 2015-06-27 ENCOUNTER — Ambulatory Visit (INDEPENDENT_AMBULATORY_CARE_PROVIDER_SITE_OTHER): Payer: Managed Care, Other (non HMO) | Admitting: Internal Medicine

## 2015-06-27 VITALS — BP 116/78 | HR 95 | Ht 63.0 in | Wt 147.0 lb

## 2015-06-27 DIAGNOSIS — J45998 Other asthma: Secondary | ICD-10-CM | POA: Diagnosis not present

## 2015-06-27 DIAGNOSIS — J309 Allergic rhinitis, unspecified: Secondary | ICD-10-CM

## 2015-06-27 DIAGNOSIS — J3089 Other allergic rhinitis: Secondary | ICD-10-CM

## 2015-06-27 DIAGNOSIS — J302 Other seasonal allergic rhinitis: Secondary | ICD-10-CM

## 2015-06-27 DIAGNOSIS — J330 Polyp of nasal cavity: Secondary | ICD-10-CM

## 2015-06-27 MED ORDER — MONTELUKAST SODIUM 10 MG PO TABS: 10.0000 mg | ORAL_TABLET | Freq: Every day | ORAL | Status: DC

## 2015-06-27 MED ORDER — IPRATROPIUM BROMIDE HFA 17 MCG/ACT IN AERS: 2.0000 | INHALATION_SPRAY | Freq: Four times a day (QID) | RESPIRATORY_TRACT | Status: DC

## 2015-06-27 NOTE — Patient Instructions (Signed)
Refill Singulair sent to [facility]  Refill Atrovent inhaler sent to [facility]  Please call as needed

## 2015-06-27 NOTE — Progress Notes (Signed)
Patient ID: Sonia [name], female    DOB: 07-Dec-1955, 60 y.o.   MRN: [id number]  HPI 04/26/09- 62 yo F never smoker (sister Sonia [name] w/ severe asthma and rhinitis). Last here for f/u of asthma and allergic rhinitis with hx nasal polyps  . At one year f/u she denies major events. Around eyes she gets some periorbital dermatitis and watery conjunctival irritation. Not taking Singulair due to cost. Using daily allegra and flonase. Rarely wheezed a little in past year- used atrovent rescue inhaler only once.   04/30/11- 66 yo F never smoker (sister Sonia [name] w/ severe asthma and rhinitis). Last here for f/u of asthma and allergic rhinitis with hx nasal polyps Has been using Sudafed for popping in her ears, malaise and head congestion. Denies chest tightness or wheeze, reflux or GI upset. Tussionex helps.    04/29/12- 16 yo F never smoker (sister Sonia [name] w/ severe asthma and rhinitis). Last here for f/u of asthma and allergic rhinitis with hx nasal polyps FOLLOWS [id number] any SOB, wheezing, cough, or congestion Did pretty well through the winter with no acute respiratory illness, significant wheezing or nasal congestion problems. She has begun noticing nasal stuffiness in the mornings, consistent with spring pollen. Now taking fexofenadine, Flonase and Singulair which we reviewed.  06/20/13- 46 yo F never smoker (sister Sonia [name] w/ severe asthma and rhinitis). Last here for f/u of asthma and allergic rhinitis with hx nasal polyps FOLLOWS FOR: has had increased congeston due to pollen; otherwise doing well overall. Minor nasal stuffiness with spring pollen but otherwise doing very well. Continues Flonase, Singulair, Allegra. Rarely uses Atrovent inhaler. Little chest tightness or wheeze.  06/21/14- 36 yo F never smoker (sister Sonia [name] w/ severe asthma and rhinitis) for f/u of asthma and allergic rhinitis with hx nasal polyps FOLLOWS FOR: Denies any troubles at this time; no wheezing,  SOB, cough, or congestion. Comfortable with OTC medication for nasal congestion during the spring season.  06/27/2015-60 year old female never smoker (Sr. Sonia [name] w severe asthma/ rhinitis) followed for asthma, allergic rhinitis/nasal polyps FOLLOWS FOR: pt states allergies & asthma are doing well. no new concerns today. She has felt very well recently with no major problems through the winter or through the spring pollen season. Only occasionally uses her Atrovent rescue inhaler.  ROS-see HPI Constitutional:   No-   weight loss, night sweats, fevers, chills, fatigue, lassitude. HEENT:   No-  headaches, difficulty swallowing, tooth/dental problems, sore throat,      No-sneezing, itching, ear ache,+ nasal congestion, +post nasal drip,  CV:  No-   chest pain, orthopnea, PND, swelling in lower extremities, anasarca, dizziness, palpitations Resp: No-   shortness of breath with exertion or at rest.              No-   productive cough,  No non-productive cough,  No- coughing up of blood.              No-   change in color of mucus.  No- wheezing.   Skin: No-   rash or lesions. GI:  No-   heartburn, indigestion, abdominal pain, nausea, vomiting,  GU:  MS:  No-   joint pain or swelling.   Neuro-     nothing unusual Psych:  No- change in mood or affect. No depression or anxiety.  No memory loss.   OBJ- Physical Exam General- Alert, Oriented, Affect-appropriate, Distress- none acute Skin- rash-none, lesions- none, excoriation- none Lymphadenopathy- none Head-  atraumatic            Eyes- Gross vision intact, PERRLA, conjunctivae and secretions clear            Ears- Hearing, canals-normal            Nose- +pale turbinate edema, no-Septal dev, mucus, polyps, erosion, perforation             Throat- Mallampati II , mucosa clear , drainage- none, tonsils- atrophic Neck- flexible , trachea midline, no stridor , thyroid nl, carotid no bruit Chest - symmetrical excursion , unlabored            Heart/CV- RRR , no murmur , no gallop  , no rub, nl s1 s2                           - JVD- none , edema- none, stasis changes- none, varices- none           Lung- clear to P&A, wheeze- none, cough- none , dullness-none, rub- none           Chest wall-  Abd-  Br/ Gen/ Rectal- Not done, not indicated Extrem- cyanosis- none, clubbing, none, atrophy- none, strength- nl Neuro- grossly intact to observation

## 2015-07-01 NOTE — Assessment & Plan Note (Signed)
None visible on anterior exam at this meeting

## 2015-07-01 NOTE — Assessment & Plan Note (Signed)
She has felt well controlled with Singulair and occasional antihistamine

## 2015-07-01 NOTE — Assessment & Plan Note (Signed)
Mild intermittent well-controlled. Plan-refill Atrovent inhaler, Singulair 

## 2015-10-07 ENCOUNTER — Other Ambulatory Visit: Payer: Self-pay | Admitting: Certified Nurse Midwife

## 2015-10-08 NOTE — Telephone Encounter (Signed)
Medication refill request: acyclovir  Last AEX:  03/14/15 DL Next AEX: 03/17/16 DL Last MMG (if hormonal medication request): 01/01/14 [id number]:neg  Refill authorized: 05/14/15 #[id number]. Today please advise.

## 2015-10-17 ENCOUNTER — Encounter: Payer: Self-pay | Admitting: Gastroenterology

## 2015-11-20 ENCOUNTER — Other Ambulatory Visit: Payer: Self-pay | Admitting: Certified Nurse Midwife

## 2015-11-20 ENCOUNTER — Other Ambulatory Visit: Payer: Self-pay | Admitting: Internal Medicine

## 2015-11-20 DIAGNOSIS — Z1231 Encounter for screening mammogram for malignant neoplasm of breast: Secondary | ICD-10-CM

## 2015-12-02 ENCOUNTER — Ambulatory Visit: Payer: Managed Care, Other (non HMO) | Admitting: Gastroenterology

## 2015-12-16 ENCOUNTER — Ambulatory Visit
Admission: RE | Admit: 2015-12-16 | Discharge: 2015-12-16 | Disposition: A | Discharge status: Home / Self Care | Payer: Managed Care, Other (non HMO) | Source: Ambulatory Visit | Attending: Certified Nurse Midwife | Admitting: Certified Nurse Midwife

## 2015-12-16 DIAGNOSIS — Z1231 Encounter for screening mammogram for malignant neoplasm of breast: Secondary | ICD-10-CM

## 2015-12-30 ENCOUNTER — Ambulatory Visit (INDEPENDENT_AMBULATORY_CARE_PROVIDER_SITE_OTHER): Payer: Managed Care, Other (non HMO) | Admitting: Gastroenterology

## 2015-12-30 ENCOUNTER — Encounter: Payer: Self-pay | Admitting: Gastroenterology

## 2015-12-30 ENCOUNTER — Encounter (INDEPENDENT_AMBULATORY_CARE_PROVIDER_SITE_OTHER): Payer: Self-pay

## 2015-12-30 VITALS — BP 120/72 | HR 72 | Ht 63.0 in | Wt 151.5 lb

## 2015-12-30 DIAGNOSIS — Z1211 Encounter for screening for malignant neoplasm of colon: Secondary | ICD-10-CM | POA: Diagnosis not present

## 2015-12-30 DIAGNOSIS — D509 Iron deficiency anemia, unspecified: Secondary | ICD-10-CM | POA: Diagnosis not present

## 2015-12-30 DIAGNOSIS — K5900 Constipation, unspecified: Secondary | ICD-10-CM | POA: Diagnosis not present

## 2015-12-30 NOTE — Patient Instructions (Signed)
You have been scheduled for an endoscopy and colonoscopy. Please follow the written instructions given to you at your visit today. Please pick up your prep supplies at the pharmacy within the next 1-3 days. If you use inhalers (even only as needed), please bring them with you on the day of your procedure. Your physician has requested that you go to www.startemmi.com and enter the access code given to you at your visit today. This web site gives a general overview about your procedure. However, you should still follow specific instructions given to you by our office regarding your preparation for the procedure.  Use Benefiber 1 tablespoon three times a day   Follow up as needed

## 2015-12-30 NOTE — Progress Notes (Signed)
Sonia [name]    [id number]    Dec 19, 1955  Primary Care Physician:PHARR,WALTER DAVIDSON, MD  Referring Physician: Deland Pretty, MD [address] [place], [place] [address]  Chief complaint:  Iron deficiency anemia, screening colonoscopy  HPI:  34 yr F with HTN, hyperlipidemia, diabetes here to discuss screening colonoscopy. Last colonoscopy was in August 2007 by Dr. Lajoyce Corners and was normal per patient. She has iron deficiency anemia with hemoglobin ranging from 10-12 and ferritin of 6. She has been taking oral iron tablets intermittently about 3-4 times a week. She is afraid of taking it on regular basis as makes her constipation worse. She has irregular bowel habits with bowel movements ranging from every day to once every 2-3 days and she feels oral Iron is making it worse. Her maternal grandmother had rectal cancer in her 70s and her mother was diagnosed with cholangiocarcinoma. Denies any nausea, vomiting, abdominal pain, melena or bright red blood per rectum   Outpatient Encounter Prescriptions as of 12/30/2015  Medication Sig  . acyclovir (ZOVIRAX) 400 MG tablet TAKE 1 TABLET (400 MG TOTAL) BY MOUTH DAILY.  Marland Kitchen azithromycin (ZITHROMAX) 250 MG tablet Take 2 today, then 1 daily until gone.  . fexofenadine (ALLEGRA) 180 MG tablet Take 180 mg by mouth daily.    . fluticasone (FLONASE) 50 MCG/ACT nasal spray INSTILL 2 SPRAYS IN EACH NOSTRIL EVERY DAY  . HUMALOG KWIKPEN 100 UNIT/ML injection Take as directed  . insulin glargine (LANTUS) 100 units/mL SOLN Inject into the skin daily. 20 units  . ipratropium (ATROVENT HFA) 17 MCG/ACT inhaler Inhale 2 puffs into the lungs every 6 (six) hours. As needed  . lisinopril (PRINIVIL,ZESTRIL) 10 MG tablet Take 10 mg by mouth daily.    . metFORMIN (GLUCOPHAGE) 1000 MG tablet Take 1,000 mg by mouth 2 (two) times daily.    . montelukast (SINGULAIR) 10 MG tablet Take 1 tablet (10 mg total) by mouth daily.  . naphazoline (NAPHCON)  0.1 % ophthalmic solution Place 1 drop into both eyes as needed for irritation.  . naratriptan (AMERGE) 2.5 MG tablet Take 2.5 mg by mouth as needed. Take one (1) tablet at onset of headache; if returns or does not resolve, may repeat after 4 hours; do not exceed five (5) mg in 24 hours.   Marland Kitchen NEXIUM 40 MG capsule Take 1 tablet by mouth daily.  . simvastatin (ZOCOR) 20 MG tablet Take 20 mg by mouth daily.     No facility-administered encounter medications on file as of 12/30/2015.     Allergies as of 12/30/2015 - Review Complete 12/30/2015  Allergen Reaction Noted  . Ciprofloxacin    . Doxycycline  04/30/2011  . Flagyl [metronidazole]  01/16/2013  . Penicillins    . Amoxicillin Swelling and Rash 01/16/2013    Past Medical History:  Diagnosis Date  . Abnormal Pap smear 1990  . Allergic rhinitis, cause unspecified   . Anxiety   . Asthma   . Diabetes mellitus without complication ([place])    on insulin  . Fibroid   . Hypertension   . Polyp of nasal cavity   . STD (sexually transmitted disease)    HSV2  . Unspecified asthma(493.90)     Past Surgical History:  Procedure Laterality Date  . CRYOTHERAPY  1990   no HPV changes, no dysplasia, chronic cervicitis  . LAPAROSCOPIC OVARIAN CYSTECTOMY     bilateral 2004  . ROTATOR CUFF REPAIR  2006 left  shoulder  . SUPRACERVICAL ABDOMINAL HYSTERECTOMY  2002   cervix & ovaries retained  . TONSILLECTOMY      Family History  Problem Relation Age of Onset  . Asthma Sister     SEVERE CHRONIC  . Cancer Mother     bile duct liver cancer  . Thyroid disease Mother   . Cancer Father     leukemia  . Cancer Maternal Grandmother     stomach that started around rectum  . Heart attack Maternal Grandfather   . Stroke Paternal Grandmother   . Cancer Paternal Grandfather     started in kidneys    Social History   Social History  . Marital status: Married    Spouse name: N/A  . Number of children: N/A  . Years of education: N/A    Occupational History  .  [place] History Main Topics  . Smoking status: Never Smoker  . Smokeless tobacco: Never Used  . Alcohol use No  . Drug use: No  . Sexual activity: Yes    Partners: Male    Birth control/ protection: Surgical     Comment: supracervical hysterectomy   Other Topics Concern  . Not on file   Social History Narrative  . No narrative on file      Review of systems: Review of Systems  Constitutional: Negative for fever and chills.  HENT: Negative. Positive for ringing  Eyes: Negative for blurred vision.  Respiratory: Negative for cough, shortness of breath and wheezing.   Cardiovascular: Negative for chest pain and palpitations.  Gastrointestinal: as per HPI Genitourinary: Negative for dysuria, urgency, frequency and hematuria.  Musculoskeletal: Negative for myalgias, back pain and joint pain.  Skin: Negative for itching and rash.  Neurological: Negative for dizziness, tremors, focal weakness, seizures and loss of consciousness. Positive for migraine Endo/Heme/Allergies: Positive for seasonal allergies.  Psychiatric/Behavioral: Negative for depression, suicidal ideas and hallucinations.  All other systems reviewed and are negative.   Physical Exam: Vitals:   12/30/15 0827  BP: 120/72  Pulse: 72   Body mass index is 26.84 kg/m. Gen:      No acute distress HEENT:  EOMI, sclera anicteric Neck:     No masses; no thyromegaly Lungs:    Clear to auscultation bilaterally; normal respiratory effort CV:         Regular rate and rhythm; no murmurs Abd:      + bowel sounds; soft, non-tender; no palpable masses, no distension Ext:    No edema; adequate peripheral perfusion Skin:      Warm and dry; no rash Neuro: alert and oriented x 3 Psych: normal mood and affect  Data Reviewed:  Reviewed labs, radiology imaging, old records and pertinent past GI work up   Assessment and Plan/Recommendations: 60 year old female with  history of type 2 diabetes, hypertension, hyperlipidemia here for evaluation of iron deficiency anemia. Last colonoscopy 10 years ago in August 2007 We'll schedule for EGD and colonoscopy for evaluation The risks and benefits as well as alternatives of endoscopic procedure(s) have been discussed and reviewed. All questions answered. The patient agrees to proceed. Constipation with irregular bowel habits: Start Benefiber 1 tablespoon 3 times a day with meals Return as needed  Greater than 50% of the time used for counseling as well as treatment plan and follow-up. She had multiple questions which were answered to her satisfaction  K. Denzil Magnuson , MD [phone] Mon-Fri 8a-5p [phone] after 5p, weekends, holidays  CC: Deland Pretty,  MD   

## 2016-02-06 ENCOUNTER — Encounter: Payer: Self-pay | Admitting: Gastroenterology

## 2016-02-18 ENCOUNTER — Encounter: Payer: Self-pay | Admitting: Gastroenterology

## 2016-02-27 ENCOUNTER — Ambulatory Visit (AMBULATORY_SURGERY_CENTER): Payer: Managed Care, Other (non HMO) | Admitting: Gastroenterology

## 2016-02-27 ENCOUNTER — Encounter: Payer: Self-pay | Admitting: Gastroenterology

## 2016-02-27 VITALS — BP 99/55 | HR 69 | Temp 98.0°F | Resp 15 | Ht 63.0 in | Wt 151.0 lb

## 2016-02-27 DIAGNOSIS — D509 Iron deficiency anemia, unspecified: Secondary | ICD-10-CM

## 2016-02-27 DIAGNOSIS — D120 Benign neoplasm of cecum: Secondary | ICD-10-CM | POA: Diagnosis not present

## 2016-02-27 DIAGNOSIS — Z1211 Encounter for screening for malignant neoplasm of colon: Secondary | ICD-10-CM

## 2016-02-27 DIAGNOSIS — K319 Disease of stomach and duodenum, unspecified: Secondary | ICD-10-CM

## 2016-02-27 LAB — GLUCOSE, CAPILLARY
Glucose-Capillary: 347 mg/dL — ABNORMAL HIGH (ref 65–99)
Glucose-Capillary: 298 mg/dL — ABNORMAL HIGH (ref 65–99)

## 2016-02-27 MED ORDER — SODIUM CHLORIDE 0.9 % IV SOLN: 500.0000 mL | INTRAVENOUS | Status: DC

## 2016-02-27 NOTE — Patient Instructions (Signed)
YOU HAD AN ENDOSCOPIC PROCEDURE TODAY AT [facility]:   Refer to the procedure report that was given to you for any specific questions about what was found during the examination.  If the procedure report does not answer your questions, please call your gastroenterologist to clarify.  If you requested that your care partner not be given the details of your procedure findings, then the procedure report has been included in a sealed envelope for you to review at your convenience later.  YOU SHOULD EXPECT: Some feelings of bloating in the abdomen. Passage of more gas than usual.  Walking can help get rid of the air that was put into your GI tract during the procedure and reduce the bloating. If you had a lower endoscopy (such as a colonoscopy or flexible sigmoidoscopy) you may notice spotting of blood in your stool or on the toilet paper. If you underwent a bowel prep for your procedure, you may not have a normal bowel movement for a few days.  Please Note:  You might notice some irritation and congestion in your nose or some drainage.  This is from the oxygen used during your procedure.  There is no need for concern and it should clear up in a day or so.  SYMPTOMS TO REPORT IMMEDIATELY:   Following lower endoscopy (colonoscopy or flexible sigmoidoscopy):  Excessive amounts of blood in the stool  Significant tenderness or worsening of abdominal pains  Swelling of the abdomen that is new, acute  Fever of 100F or higher   Following upper endoscopy (EGD)  Vomiting of blood or coffee ground material  New chest pain or pain under the shoulder blades  Painful or persistently difficult swallowing  New shortness of breath  Fever of 100F or higher  Black, tarry-looking stools  For urgent or emergent issues, a gastroenterologist can be reached at any hour by calling [phone].   DIET:  We do recommend a small meal at first, but then you may proceed to your regular diet.  Drink  plenty of fluids but you should avoid alcoholic beverages for 24 hours.  ACTIVITY:  You should plan to take it easy for the rest of today and you should NOT DRIVE or use heavy machinery until tomorrow (because of the sedation medicines used during the test).    FOLLOW UP: Our staff will call the number listed on your records the next business day following your procedure to check on you and address any questions or concerns that you may have regarding the information given to you following your procedure. If we do not reach you, we will leave a message.  However, if you are feeling well and you are not experiencing any problems, there is no need to return our call.  We will assume that you have returned to your regular daily activities without incident.  If any biopsies were taken you will be contacted by phone or by letter within the next 1-3 weeks.  Please call us at [phone] if you have not heard about the biopsies in 3 weeks.    SIGNATURES/CONFIDENTIALITY: You and/or your care partner have signed paperwork which will be entered into your electronic medical record.  These signatures attest to the fact that that the information above on your After Visit Summary has been reviewed and is understood.  Full responsibility of the confidentiality of this discharge information lies with you and/or your care-partner.   Discharge instructions given. Handouts on polyps,gastritis and esophagitis. Resume previous  medications. Avoid nsaids. Scheduling repeat colonoscopy for tomorrow.

## 2016-02-27 NOTE — Op Note (Signed)
[facility] Patient Name: Sonia [name] Procedure Date: 02/27/2016 9:40 AM MRN: [id number] Endoscopist: Mauri Pole , MD Age: 61 Referring MD:  Date of Birth: 1955-10-19 Gender: Female Account #: [id number] Procedure:                Colonoscopy Indications:              Unexplained iron deficiency anemia, Last                            colonoscopy: 2007 Medicines:                Monitored Anesthesia Care Procedure:                Pre-Anesthesia Assessment:                           - Prior to the procedure, a History and Physical                            was performed, and patient medications and                            allergies were reviewed. The patient's tolerance of                            previous anesthesia was also reviewed. The risks                            and benefits of the procedure and the sedation                            options and risks were discussed with the patient.                            All questions were answered, and informed consent                            was obtained. Prior Anticoagulants: The patient has                            taken no previous anticoagulant or antiplatelet                            agents. ASA Grade Assessment: II - A patient with                            mild systemic disease. After reviewing the risks                            and benefits, the patient was deemed in                            satisfactory condition to undergo the procedure.  After obtaining informed consent, the colonoscope                            was passed under direct vision. Throughout the                            procedure, the patient's blood pressure, pulse, and                            oxygen saturations were monitored continuously. The                            Model CF-HQ190L [id number]) scope was introduced                            through the anus and advanced to the the  cecum,                            identified by appendiceal orifice and ileocecal                            valve. The colonoscopy was performed without                            difficulty. The patient tolerated the procedure                            well. The quality of the bowel preparation was                            poor. The ileocecal valve, appendiceal orifice, and                            rectum were photographed. Scope In: 9:52:55 AM Scope Out: 10:15:48 AM Scope Withdrawal Time: 0 hours 7 minutes 39 seconds  Total Procedure Duration: 0 hours 22 minutes 53 seconds  Findings:                 The perianal and digital rectal examinations were                            normal.                           A 2 mm polyp was found in the cecum. The polyp was                            sessile. The polyp was removed with a cold biopsy                            forceps. Resection and retrieval were complete.                           A moderate amount of stool was found in the entire  colon, interfering with visualization. Lavage of                            the area was performed, resulting in incomplete                            clearance with continued poor visualization. Complications:            No immediate complications. Estimated Blood Loss:     Estimated blood loss was minimal. Impression:               - Preparation of the colon was poor.                           - One 2 mm polyp in the cecum, removed with a cold                            biopsy forceps. Resected and retrieved.                           - Stool in the entire examined colon. Recommendation:           - Patient has a contact number available for                            emergencies. The signs and symptoms of potential                            delayed complications were discussed with the                            patient. Return to normal activities tomorrow.                             Written discharge instructions were provided to the                            patient.                           - Resume previous diet.                           - Continue present medications.                           - Await pathology results.                           - Repeat colonoscopy at the next available                            appointment because the bowel preparation was                            suboptimal.                           -  Return to GI clinic PRN. Mauri Pole, MD 02/27/2016 10:30:01 AM This report has been signed electronically.

## 2016-02-27 NOTE — Progress Notes (Signed)
Called to room to assist during endoscopic procedure.  Patient ID and intended procedure confirmed with present staff. Received instructions for my participation in the procedure from the performing physician.  

## 2016-02-27 NOTE — Progress Notes (Signed)
Report to PACU, RN, vss, BBS= Clear.  

## 2016-02-27 NOTE — Op Note (Signed)
[facility] Patient Name: Sonia [name] Procedure Date: 02/27/2016 9:40 AM MRN: [id number] Endoscopist: Mauri Pole , MD Age: 61 Referring MD:  Date of Birth: [birth year] Gender: Female Account #: [id number] Procedure:                Upper GI endoscopy Indications:              Suspected upper gastrointestinal bleeding in                            patient with unexplained iron deficiency anemia Medicines:                Monitored Anesthesia Care Procedure:                Pre-Anesthesia Assessment:                           - Prior to the procedure, a History and Physical                            was performed, and patient medications and                            allergies were reviewed. The patient's tolerance of                            previous anesthesia was also reviewed. The risks                            and benefits of the procedure and the sedation                            options and risks were discussed with the patient.                            All questions were answered, and informed consent                            was obtained. Prior Anticoagulants: The patient has                            taken no previous anticoagulant or antiplatelet                            agents. ASA Grade Assessment: II - A patient with                            mild systemic disease. After reviewing the risks                            and benefits, the patient was deemed in                            satisfactory condition to undergo the procedure.  After obtaining informed consent, the endoscope was                            passed under direct vision. Throughout the                            procedure, the patient's blood pressure, pulse, and                            oxygen saturations were monitored continuously. The                            Model GIF-HQ190 [id number] scope was introduced                            through  the mouth, and advanced to the second part                            of duodenum. The upper GI endoscopy was                            accomplished without difficulty. The patient                            tolerated the procedure well. Scope In: Scope Out: Findings:                 LA Grade A (one or more mucosal breaks less than 5                            mm, not extending between tops of 2 mucosal folds)                            esophagitis with no bleeding was found 33 to 35 cm                            from the incisors.                           Scattered moderate inflammation characterized by                            adherent blood, congestion (edema), erosions and                            erythema was found in the gastric body. Biopsies                            were taken with a cold forceps for histology.                            Biopsies were taken with a cold forceps for  Helicobacter pylori testing using CLOtest.                           The examined duodenum was normal. Complications:            No immediate complications. Estimated Blood Loss:     Estimated blood loss was minimal. Impression:               - LA Grade A non-erosive esophagitis.                           - Gastritis. Biopsied.                           - Normal examined duodenum.                           - No specimens collected. Recommendation:           - Patient has a contact number available for                            emergencies. The signs and symptoms of potential                            delayed complications were discussed with the                            patient. Return to normal activities tomorrow.                            Written discharge instructions were provided to the                            patient.                           - Resume previous diet.                           - Continue present medications.                           -  Await pathology results.                           - No aspirin, ibuprofen, naproxen, or other                            non-steroidal anti-inflammatory drugs. Mauri Pole, MD 02/27/2016 10:27:12 AM This report has been signed electronically.

## 2016-02-28 ENCOUNTER — Telehealth: Payer: Self-pay

## 2016-02-28 ENCOUNTER — Ambulatory Visit (AMBULATORY_SURGERY_CENTER): Payer: Managed Care, Other (non HMO) | Admitting: Gastroenterology

## 2016-02-28 ENCOUNTER — Other Ambulatory Visit (INDEPENDENT_AMBULATORY_CARE_PROVIDER_SITE_OTHER): Payer: Managed Care, Other (non HMO)

## 2016-02-28 ENCOUNTER — Encounter: Payer: Self-pay | Admitting: Gastroenterology

## 2016-02-28 VITALS — BP 85/52 | HR 78 | Temp 98.0°F | Resp 20 | Ht 63.0 in | Wt 151.0 lb

## 2016-02-28 DIAGNOSIS — D509 Iron deficiency anemia, unspecified: Secondary | ICD-10-CM

## 2016-02-28 DIAGNOSIS — Z1212 Encounter for screening for malignant neoplasm of rectum: Secondary | ICD-10-CM

## 2016-02-28 DIAGNOSIS — Z1211 Encounter for screening for malignant neoplasm of colon: Secondary | ICD-10-CM | POA: Diagnosis present

## 2016-02-28 LAB — GLUCOSE, CAPILLARY
Glucose-Capillary: 329 mg/dL — ABNORMAL HIGH (ref 65–99)
Glucose-Capillary: 243 mg/dL — ABNORMAL HIGH (ref 65–99)

## 2016-02-28 LAB — HELICOBACTER PYLORI SCREEN-BIOPSY: UREASE: NEGATIVE

## 2016-02-28 MED ORDER — ESOMEPRAZOLE MAGNESIUM 40 MG PO CPDR: 40.0000 mg | DELAYED_RELEASE_CAPSULE | Freq: Every day | ORAL | 6 refills | Status: DC

## 2016-02-28 MED ORDER — SODIUM CHLORIDE 0.9 % IV SOLN: 500.0000 mL | INTRAVENOUS | Status: DC

## 2016-02-28 NOTE — Op Note (Addendum)
[facility] Patient Name: Sonia [name] Procedure Date: 02/28/2016 3:57 PM MRN: [id number] Endoscopist: Mauri Pole , MD Age: 61 Referring MD:  Date of Birth: 26-Jan-1956 Gender: Female Account #: [id number] Procedure:                Colonoscopy Indications:              Screening for malignant neoplasm in the rectum Medicines:                Monitored Anesthesia Care Procedure:                Pre-Anesthesia Assessment:                           - Prior to the procedure, a History and Physical                            was performed, and patient medications and                            allergies were reviewed. The patient's tolerance of                            previous anesthesia was also reviewed. The risks                            and benefits of the procedure and the sedation                            options and risks were discussed with the patient.                            All questions were answered, and informed consent                            was obtained. Prior Anticoagulants: The patient has                            taken no previous anticoagulant or antiplatelet                            agents. ASA Grade Assessment: II - A patient with                            mild systemic disease. After reviewing the risks                            and benefits, the patient was deemed in                            satisfactory condition to undergo the procedure.                           After obtaining informed consent, the colonoscope  was passed under direct vision. Throughout the                            procedure, the patient's blood pressure, pulse, and                            oxygen saturations were monitored continuously. The                            Model CF-HQ190L [id number]) scope was introduced                            through the anus and advanced to the the terminal                            ileum,  with identification of the appendiceal                            orifice and IC valve. The colonoscopy was performed                            without difficulty. The patient tolerated the                            procedure well. The quality of the bowel                            preparation was excellent. The terminal ileum,                            ileocecal valve, appendiceal orifice, and rectum                            were photographed. Scope In: 4:10:29 PM Scope Out: 4:30:03 PM Scope Withdrawal Time: 0 hours 13 minutes 22 seconds  Total Procedure Duration: 0 hours 19 minutes 34 seconds  Findings:                 The perianal and digital rectal examinations were                            normal.                           Non-bleeding internal hemorrhoids were found during                            retroflexion. The hemorrhoids were small.                           The exam was otherwise without abnormality. Complications:            No immediate complications. Estimated Blood Loss:     Estimated blood loss: none. Impression:               - Non-bleeding internal hemorrhoids.                           -  The examination was otherwise normal.                           - No specimens collected. Recommendation:           - Patient has a contact number available for                            emergencies. The signs and symptoms of potential                            delayed complications were discussed with the                            patient. Return to normal activities tomorrow.                            Written discharge instructions were provided to the                            patient.                           - Resume previous diet.                           - Continue present medications.                           - Repeat colonoscopy in 5-10 years for surveillance. Mauri Pole, MD 02/28/2016 4:33:39 PM This report has been signed  electronically. Addendum Number: 1   Addendum Date: 03/03/2016 2:25:02 PM      Repeat Colonoscopy in 10 years for screening Mauri Pole, MD 03/03/2016 2:25:28 PM This report has been signed electronically.

## 2016-02-28 NOTE — Patient Instructions (Signed)
Impression/Recommendations:  Hemorrhoid handout given to patient.  Nexium 40 mg. Once daily by mouth.  Repeat colonoscopy in 5-10 years based on pathology.  YOU HAD AN ENDOSCOPIC PROCEDURE TODAY AT [facility]:   Refer to the procedure report that was given to you for any specific questions about what was found during the examination.  If the procedure report does not answer your questions, please call your gastroenterologist to clarify.  If you requested that your care partner not be given the details of your procedure findings, then the procedure report has been included in a sealed envelope for you to review at your convenience later.  YOU SHOULD EXPECT: Some feelings of bloating in the abdomen. Passage of more gas than usual.  Walking can help get rid of the air that was put into your GI tract during the procedure and reduce the bloating. If you had a lower endoscopy (such as a colonoscopy or flexible sigmoidoscopy) you may notice spotting of blood in your stool or on the toilet paper. If you underwent a bowel prep for your procedure, you may not have a normal bowel movement for a few days.  Please Note:  You might notice some irritation and congestion in your nose or some drainage.  This is from the oxygen used during your procedure.  There is no need for concern and it should clear up in a day or so.  SYMPTOMS TO REPORT IMMEDIATELY:   Following lower endoscopy (colonoscopy or flexible sigmoidoscopy):  Excessive amounts of blood in the stool  Significant tenderness or worsening of abdominal pains  Swelling of the abdomen that is new, acute  Fever of 100F or higher For urgent or emergent issues, a gastroenterologist can be reached at any hour by calling [phone].   DIET:  We do recommend a small meal at first, but then you may proceed to your regular diet.  Drink plenty of fluids but you should avoid alcoholic beverages for 24 hours.  ACTIVITY:  You should plan  to take it easy for the rest of today and you should NOT DRIVE or use heavy machinery until tomorrow (because of the sedation medicines used during the test).    FOLLOW UP: Our staff will call the number listed on your records the next business day following your procedure to check on you and address any questions or concerns that you may have regarding the information given to you following your procedure. If we do not reach you, we will leave a message.  However, if you are feeling well and you are not experiencing any problems, there is no need to return our call.  We will assume that you have returned to your regular daily activities without incident.  If any biopsies were taken you will be contacted by phone or by letter within the next 1-3 weeks.  Please call us at [phone] if you have not heard about the biopsies in 3 weeks.    SIGNATURES/CONFIDENTIALITY: You and/or your care partner have signed paperwork which will be entered into your electronic medical record.  These signatures attest to the fact that that the information above on your After Visit Summary has been reviewed and is understood.  Full responsibility of the confidentiality of this discharge information lies with you and/or your care-partner.

## 2016-02-28 NOTE — Progress Notes (Signed)
A/ox3 pleased with MAC, report to [employer]

## 2016-02-28 NOTE — Telephone Encounter (Signed)
  Follow up Call-  Call back number 02/27/2016  Post procedure Call Back phone  # [phone]  Permission to leave phone message Yes  Some recent data might be hidden     Patient questions:  Do you have a fever, pain , or abdominal swelling? No. Pain Score  0 *  Have you tolerated food without any problems? Yes.    Have you been able to return to your normal activities? Yes.    Do you have any questions about your discharge instructions: Diet   No. Medications  No. Follow up visit  No.  Do you have questions or concerns about your Care? No.  Actions: * If pain score is 4 or above: No action needed, pain <4.

## 2016-03-02 ENCOUNTER — Telehealth: Payer: Self-pay

## 2016-03-02 NOTE — Telephone Encounter (Signed)
  Follow up Call-  Call back number 02/28/2016 02/27/2016  Post procedure Call Back phone  # [phone]  Permission to leave phone message Yes Yes  Some recent data might be hidden    Patient was called for follow up after her procedure on 02/28/2016. No answer at the number given for follow up phone call. A message was left on the answering machine.

## 2016-03-02 NOTE — Telephone Encounter (Signed)
  Follow up Call-  Call back number 02/28/2016 02/27/2016  Post procedure Call Back phone  # [phone]  Permission to leave phone message Yes Yes  Some recent data might be hidden    Patient was called for follow up after her procedure on 02/28/2016. No answer at the number given for follow up phone call. A message was left on the answering machine.

## 2016-03-03 ENCOUNTER — Encounter: Payer: Self-pay | Admitting: Gastroenterology

## 2016-03-17 ENCOUNTER — Ambulatory Visit: Payer: Managed Care, Other (non HMO) | Admitting: Certified Nurse Midwife

## 2016-03-25 ENCOUNTER — Ambulatory Visit (INDEPENDENT_AMBULATORY_CARE_PROVIDER_SITE_OTHER): Payer: 59 | Admitting: Certified Nurse Midwife

## 2016-03-25 ENCOUNTER — Encounter: Payer: Self-pay | Admitting: Certified Nurse Midwife

## 2016-03-25 VITALS — BP 108/64 | HR 70 | Resp 16 | Ht 63.0 in | Wt 151.0 lb

## 2016-03-25 DIAGNOSIS — Z01419 Encounter for gynecological examination (general) (routine) without abnormal findings: Secondary | ICD-10-CM | POA: Diagnosis not present

## 2016-03-25 NOTE — Progress Notes (Signed)
60 y.o. G0P0000 Married  Caucasian Fe here for annual exam. Menopausal no HRT. Denies vaginal bleeding or vaginal dryness. Recent colonoscopy and endoscopy. Polyps were noted and needs repeat colonoscopy in 5 years.Sees Dr. Shelia Media PCP for /hypertension/asthma/cholesterol management. Also labs and aex/ every six months. Sees Dr. Chalmers Cater for glucose management. No other health issues today.  Patient's last menstrual period was 01/02/2001.          Sexually active: Yes.    The current method of family planning is supracervical hysterectomy.    Exercising: Yes.    walking Smoker:  no  Health Maintenance: Pap:  01-18-14 neg, 03-14-15 neg HPV HR neg MMG:  12-16-15 category c density birads 1:neg Colonoscopy:  2018 f/u 5 BMD:   2015 pcp manages TDaP:  2008 declines will do with PCP Shingles: no Pneumonia: 2015 Hep C and HIV: not done Labs: pcp Self breast exam: done occ   reports that she has never smoked. She has never used smokeless tobacco. She reports that she does not drink alcohol or use drugs.  Past Medical History:  Diagnosis Date  . Abnormal Pap smear 1990  . Allergic rhinitis, cause unspecified   . Allergy    SEASONAL  . Anemia   . Anxiety    DENIES  . Asthma   . Diabetes mellitus without complication ([place])    on insulin  . Fibroid   . GERD (gastroesophageal reflux disease)   . Hypertension    DENIES  . Polyp of nasal cavity   . STD (sexually transmitted disease)    HSV2  . Unspecified asthma(493.90)     Past Surgical History:  Procedure Laterality Date  . COLONOSCOPY    . CRYOTHERAPY  1990   no HPV changes, no dysplasia, chronic cervicitis  . LAPAROSCOPIC OVARIAN CYSTECTOMY     bilateral 2004  . ROTATOR CUFF REPAIR  2006 left shoulder  . SUPRACERVICAL ABDOMINAL HYSTERECTOMY  2002   cervix & ovaries retained  . TONSILLECTOMY      Current Outpatient Prescriptions  Medication Sig Dispense Refill  . acyclovir (ZOVIRAX) 400 MG tablet TAKE 1 TABLET (400 MG TOTAL)  BY MOUTH DAILY. 30 tablet 4  . esomeprazole (NEXIUM) 40 MG capsule Take 1 capsule (40 mg total) by mouth daily at 12 noon. 30 capsule 6  . ferrous sulfate 325 (65 FE) MG EC tablet Take 325 mg by mouth 3 (three) times a week.    . fexofenadine (ALLEGRA) 180 MG tablet Take 180 mg by mouth daily.      . fluticasone (FLONASE) 50 MCG/ACT nasal spray INSTILL 2 SPRAYS IN EACH NOSTRIL EVERY DAY 16 g 4  . HUMALOG KWIKPEN 100 UNIT/ML injection Take as directed    . insulin glargine (LANTUS) 100 units/mL SOLN Inject into the skin daily. 18 units    . ipratropium (ATROVENT HFA) 17 MCG/ACT inhaler Inhale 2 puffs into the lungs every 6 (six) hours. As needed 1 Inhaler prn  . lisinopril (PRINIVIL,ZESTRIL) 10 MG tablet Take 10 mg by mouth daily.      . metFORMIN (GLUCOPHAGE) 1000 MG tablet Take 1,000 mg by mouth 2 (two) times daily.      . montelukast (SINGULAIR) 10 MG tablet Take 1 tablet (10 mg total) by mouth daily. 360 tablet prn  . naphazoline (NAPHCON) 0.1 % ophthalmic solution Place 1 drop into both eyes as needed for irritation.    . naratriptan (AMERGE) 2.5 MG tablet Take 2.5 mg by mouth as needed. Take one (1) tablet  at onset of headache; if returns or does not resolve, may repeat after 4 hours; do not exceed five (5) mg in 24 hours.     . simvastatin (ZOCOR) 20 MG tablet Take 20 mg by mouth daily.       No current facility-administered medications for this visit.     Family History  Problem Relation Age of Onset  . Asthma Sister     SEVERE CHRONIC  . Cancer Mother     bile duct liver cancer  . Thyroid disease Mother   . Cancer Father     leukemia  . Cancer Maternal Grandmother     stomach that started around rectum  . Heart attack Maternal Grandfather   . Stroke Paternal Grandmother   . Cancer Paternal Grandfather     started in kidneys    ROS:  Pertinent items are noted in HPI.  Otherwise, a comprehensive ROS was negative.  Exam:   BP 108/64   Pulse 70   Resp 16   Ht 5\' 3"  (1.6  m)   Wt 151 lb (68.5 kg)   LMP 01/02/2001 Comment: supracervical hysterectomy  BMI 26.75 kg/m  Height: 5\' 3"  (160 cm) Ht Readings from Last 3 Encounters:  03/25/16 5\' 3"  (1.6 m)  02/28/16 5\' 3"  (1.6 m)  02/27/16 5\' 3"  (1.6 m)    General appearance: alert, cooperative and appears stated age Head: Normocephalic, without obvious abnormality, atraumatic Neck: no adenopathy, supple, symmetrical, trachea midline and thyroid normal to inspection and palpation Lungs: clear to auscultation bilaterally Breasts: normal appearance, no masses or tenderness, No nipple retraction or dimpling, No nipple discharge or bleeding, No axillary or supraclavicular adenopathy Heart: regular rate and rhythm Abdomen: soft, non-tender; no masses,  no organomegaly Extremities: extremities normal, atraumatic, no cyanosis or edema Skin: Skin color, texture, turgor normal. No rashes or lesions Lymph nodes: Cervical, supraclavicular, and axillary nodes normal. No abnormal inguinal nodes palpated Neurologic: Grossly normal   Pelvic: External genitalia:  no lesions              Urethra:  normal appearing urethra with no masses, tenderness or lesions              Bartholin's and Skene's: normal                 Vagina: normal appearing vagina with normal color and discharge, no lesions              Cervix: no cervical motion tenderness and no lesions              Pap taken: No. Bimanual Exam:  Uterus:  uterus absent              Adnexa: normal adnexa               Rectovaginal: Confirms               Anus:  normal sphincter tone, no lesions  Chaperone present: yes  A:  Well Woman with normal exam  Menopausal no HRT s/p supracervical abdominal hysterectomy for fibroids. Ovaries retained also  Hypertension/Glucose/cholesterol management with MD  P:   Reviewed health and wellness pertinent to exam  Aware of need to evaluate vaginal bleeding  Continue follow up with MD as indicated  Pap smear as above not  taken   counseled on breast self exam, mammography screening, adequate intake of calcium and vitamin D, diet and exercise, Kegel's exercises  return annually or prn  An  After Visit Summary was printed and given to the patient.

## 2016-03-25 NOTE — Patient Instructions (Signed)

## 2016-03-27 NOTE — Progress Notes (Signed)
Encounter reviewed Sonia Fini, MD   

## 2016-03-30 ENCOUNTER — Other Ambulatory Visit: Payer: Self-pay | Admitting: Nurse Practitioner

## 2016-03-30 NOTE — Telephone Encounter (Signed)
Medication refill request: acyclovir  Last AEX:  03-25-16  Next AEX: 03-26-17  Last MMG (if hormonal medication request): 12-16-15 WNL  Refill authorized: please advise

## 2016-06-26 ENCOUNTER — Encounter: Payer: Self-pay | Admitting: Internal Medicine

## 2016-06-26 ENCOUNTER — Ambulatory Visit (INDEPENDENT_AMBULATORY_CARE_PROVIDER_SITE_OTHER): Payer: 59 | Admitting: Internal Medicine

## 2016-06-26 DIAGNOSIS — J4520 Mild intermittent asthma, uncomplicated: Secondary | ICD-10-CM | POA: Diagnosis not present

## 2016-06-26 DIAGNOSIS — J330 Polyp of nasal cavity: Secondary | ICD-10-CM | POA: Diagnosis not present

## 2016-06-26 NOTE — Progress Notes (Signed)
Patient ID: Sonia [name], female    DOB: April 12, 1955, 60 y.o.   MRN: [id number]  HPI  female never smoker followed for asthma, allergic rhinitis/nasal polyps, complicated by DM 2, GERD, HBP  ----------------------------------------------------------------------------------------------------------  06/27/2015-61 year old female never smoker (Sr. Sonia [name] w severe asthma/ rhinitis) followed for asthma, allergic rhinitis/nasal polyps FOLLOWS FOR: pt states allergies & asthma are doing well. no new concerns today. She has felt very well recently with no major problems through the winter or through the spring pollen season. Only occasionally uses her Atrovent rescue inhaler.  06/26/16-61 year old female never smoker followed for asthma, allergic rhinitis/nasal polyps, complicated by DM 2, GERD, HBP FOLLOW UP FOR 1 year follow for asthma not had and asthma problems   Remote resection of nasal polyp, asymptomatic since. Continues Flonase. Denies routine cough or wheeze. Breathing does not bother her sleep. Uses Atrovent rescue inhaler very occasionally, Singulair. Denies cough or hives associated with lisinopril.  // Needs spirometry or PFT for documentation//  ROS-see HPI    + = pos Constitutional:   No-   weight loss, night sweats, fevers, chills, fatigue, lassitude. HEENT:   No-  headaches, difficulty swallowing, tooth/dental problems, sore throat,      No-sneezing, itching, ear ache,+ nasal congestion, +post nasal drip,  CV:  No-   chest pain, orthopnea, PND, swelling in lower extremities, anasarca, dizziness, palpitations Resp: No-   shortness of breath with exertion or at rest.              No-   productive cough,  No non-productive cough,  No- coughing up of blood.              No-   change in color of mucus.  No- wheezing.   Skin: No-   rash or lesions. GI:  No-   heartburn, indigestion, abdominal pain, nausea, vomiting,  GU:  MS:  No-   joint pain or swelling.   Neuro-      nothing unusual Psych:  No- change in mood or affect. No depression or anxiety.  No memory loss.   OBJ- Physical Exam General- Alert, Oriented, Affect-appropriate, Distress- none acute Skin- rash-none, lesions- none, excoriation- none Lymphadenopathy- none Head- atraumatic            Eyes- Gross vision intact, PERRLA, conjunctivae and secretions clear            Ears- Hearing, canals-normal            Nose-  no-Septal dev, mucus, +polyp-superior R nare, erosion, perforation             Throat- Mallampati III , mucosa clear , drainage- none, tonsils- atrophic Neck- flexible , trachea midline, no stridor , thyroid nl, carotid no bruit Chest - symmetrical excursion , unlabored           Heart/CV- RRR , no murmur , no gallop  , no rub, nl s1 s2                           - JVD- none , edema- none, stasis changes- none, varices- none           Lung- clear to P&A, wheeze- none, cough- none , dullness-none, rub- none           Chest wall-  Abd-  Br/ Gen/ Rectal- Not done, not indicated Extrem- cyanosis- none, clubbing, none, atrophy- none, strength- nl Neuro- grossly intact to observation

## 2016-06-26 NOTE — Patient Instructions (Signed)
Ok to continue present meds- please call for refills if needed

## 2016-06-27 NOTE — Assessment & Plan Note (Signed)
I don't find spirometry assessment so I made notation in the chart that we will need this at next visit. She has been so stable, with little need for rescue inhaler, that no further intervention has seemed necessary.

## 2016-06-27 NOTE — Assessment & Plan Note (Signed)
Nonobstructing polyp high in the right nostril. She uses Flonase which is appropriate. We can watch this

## 2016-08-17 DIAGNOSIS — E7800 Pure hypercholesterolemia, unspecified: Secondary | ICD-10-CM | POA: Diagnosis not present

## 2016-08-17 DIAGNOSIS — E1165 Type 2 diabetes mellitus with hyperglycemia: Secondary | ICD-10-CM | POA: Diagnosis not present

## 2016-08-24 DIAGNOSIS — E7800 Pure hypercholesterolemia, unspecified: Secondary | ICD-10-CM | POA: Diagnosis not present

## 2016-08-24 DIAGNOSIS — I10 Essential (primary) hypertension: Secondary | ICD-10-CM | POA: Diagnosis not present

## 2016-08-24 DIAGNOSIS — E1165 Type 2 diabetes mellitus with hyperglycemia: Secondary | ICD-10-CM | POA: Diagnosis not present

## 2016-09-08 DIAGNOSIS — G43009 Migraine without aura, not intractable, without status migrainosus: Secondary | ICD-10-CM | POA: Diagnosis not present

## 2016-09-18 ENCOUNTER — Telehealth: Payer: Self-pay | Admitting: Gastroenterology

## 2016-09-18 MED ORDER — ESOMEPRAZOLE MAGNESIUM 40 MG PO CPDR: 40.0000 mg | DELAYED_RELEASE_CAPSULE | Freq: Every day | ORAL | 6 refills | Status: DC

## 2016-09-18 NOTE — Telephone Encounter (Signed)
Refills sent to [facility]. 

## 2016-10-08 ENCOUNTER — Other Ambulatory Visit: Payer: Self-pay | Admitting: Internal Medicine

## 2016-11-13 DIAGNOSIS — Z0000 Encounter for general adult medical examination without abnormal findings: Secondary | ICD-10-CM | POA: Diagnosis not present

## 2016-11-13 DIAGNOSIS — E7800 Pure hypercholesterolemia, unspecified: Secondary | ICD-10-CM | POA: Diagnosis not present

## 2016-11-13 DIAGNOSIS — E559 Vitamin D deficiency, unspecified: Secondary | ICD-10-CM | POA: Diagnosis not present

## 2016-11-16 DIAGNOSIS — D509 Iron deficiency anemia, unspecified: Secondary | ICD-10-CM | POA: Diagnosis not present

## 2016-11-16 DIAGNOSIS — E119 Type 2 diabetes mellitus without complications: Secondary | ICD-10-CM | POA: Diagnosis not present

## 2016-11-18 DIAGNOSIS — I10 Essential (primary) hypertension: Secondary | ICD-10-CM | POA: Diagnosis not present

## 2016-11-18 DIAGNOSIS — E7800 Pure hypercholesterolemia, unspecified: Secondary | ICD-10-CM | POA: Diagnosis not present

## 2016-11-18 DIAGNOSIS — Z0000 Encounter for general adult medical examination without abnormal findings: Secondary | ICD-10-CM | POA: Diagnosis not present

## 2016-11-18 DIAGNOSIS — E611 Iron deficiency: Secondary | ICD-10-CM | POA: Diagnosis not present

## 2016-12-16 DIAGNOSIS — E611 Iron deficiency: Secondary | ICD-10-CM | POA: Diagnosis not present

## 2017-01-18 ENCOUNTER — Other Ambulatory Visit: Payer: Self-pay | Admitting: Certified Nurse Midwife

## 2017-01-18 DIAGNOSIS — Z139 Encounter for screening, unspecified: Secondary | ICD-10-CM

## 2017-02-16 ENCOUNTER — Ambulatory Visit
Admission: RE | Admit: 2017-02-16 | Discharge: 2017-02-16 | Disposition: A | Discharge status: Home / Self Care | Payer: 59 | Source: Ambulatory Visit | Attending: Certified Nurse Midwife | Admitting: Certified Nurse Midwife

## 2017-02-16 DIAGNOSIS — Z1231 Encounter for screening mammogram for malignant neoplasm of breast: Secondary | ICD-10-CM | POA: Diagnosis not present

## 2017-02-16 DIAGNOSIS — Z139 Encounter for screening, unspecified: Secondary | ICD-10-CM

## 2017-02-24 DIAGNOSIS — E1165 Type 2 diabetes mellitus with hyperglycemia: Secondary | ICD-10-CM | POA: Diagnosis not present

## 2017-02-24 DIAGNOSIS — I10 Essential (primary) hypertension: Secondary | ICD-10-CM | POA: Diagnosis not present

## 2017-02-24 DIAGNOSIS — E7800 Pure hypercholesterolemia, unspecified: Secondary | ICD-10-CM | POA: Diagnosis not present

## 2017-03-16 ENCOUNTER — Ambulatory Visit: Payer: BLUE CROSS/BLUE SHIELD | Admitting: Gastroenterology

## 2017-03-17 ENCOUNTER — Ambulatory Visit: Payer: BLUE CROSS/BLUE SHIELD | Admitting: Nurse Practitioner

## 2017-03-17 ENCOUNTER — Encounter: Payer: Self-pay | Admitting: Nurse Practitioner

## 2017-03-17 VITALS — BP 124/72 | HR 68 | Ht 63.0 in | Wt 159.1 lb

## 2017-03-17 DIAGNOSIS — L290 Pruritus ani: Secondary | ICD-10-CM

## 2017-03-17 NOTE — Progress Notes (Signed)
      IMPRESSION and PLAN:    62 yo female with three week hx of perianal itching and burning. No bleeding and no pain with defecation. Only a tiny internal hemorrhoid on anoscopy. She has had this before but it was much more transient / mild than now. Suspect pruritis ani. -Balneol Wipes twice daily -Prep H cream to affected area BID -avoid any deodorants / scented or new products to area. Keep area clean and dry -If no improvement in 7-10 call us to discuss referral to Dermatology   HPI:    Chief Complaint: perianal burning and itching   Patient is a 62 yo female known to Dr. Silverio Decamp. Patient is here for evaluation of perianal itching and burning which is chronic intermittent. No fecal seepage. BMs normal, no bleeding. She has tried wiping with baby wipes. Usually a warm shower and Listerine to the area helps. She tried a steroid suppository but only once since it didn't help. She has tried steroid cream but for a very limited time.   ROS: No vaginal sx. No urinary sx.    Past Medical History:  Diagnosis Date  . Abnormal Pap smear 1990  . Allergic rhinitis, cause unspecified   . Allergy    SEASONAL  . Anemia   . Anxiety    DENIES  . Asthma   . Diabetes mellitus without complication ([place])    on insulin  . Fibroid   . GERD (gastroesophageal reflux disease)   . Hypertension    DENIES  . Polyp of nasal cavity   . STD (sexually transmitted disease)    HSV2  . Unspecified asthma(493.90)     Patient's surgical history, family medical history, social history, medications and allergies were all reviewed in Epic    Physical Exam:     BP 124/72   Pulse 68   Ht 5\' 3"  (1.6 m)   Wt 159 lb 2 oz (72.2 kg)   LMP 01/02/2001 Comment: supracervical hysterectomy  BMI 28.19 kg/m   GENERAL: Well developed white female in NAD PSYCH: :Pleasant, cooperative, normal affect RECTAL: tiny perianal hemorrhoid tags. No perianal yeast or erythema. Tiny internal hemorrhoids on anosocopy   NEURO: Alert and oriented x 3, no focal neurologic deficits   Tye Savoy , NP 03/17/2017, 3:16 PM

## 2017-03-17 NOTE — Patient Instructions (Signed)
If you are age 64 or older, your body mass index should be between 23-30. Your Body mass index is 28.19 kg/m. If this is out of the aforementioned range listed, please consider follow up with your Primary Care Provider.  If you are age 50 or younger, your body mass index should be between 19-25. Your Body mass index is 28.19 kg/m. If this is out of the aformentioned range listed, please consider follow up with your Primary Care Provider.   Use Preparation H externally to affected area twice daily. Balneol wipes 2-3 times daily as needed.  KEEP AREA CLEAN AND DRY.  No deodorant/scented feminine products to area for now.  Call in 1-2 weeks if not better.  Thank you for choosing me and [facility].   Tye Savoy, NP

## 2017-03-19 ENCOUNTER — Encounter: Payer: Self-pay | Admitting: Nurse Practitioner

## 2017-03-19 NOTE — Progress Notes (Signed)
Reviewed and agree with documentation and assessment and plan. K. Veena Fama Muenchow , MD   

## 2017-03-26 ENCOUNTER — Encounter: Payer: Self-pay | Admitting: Certified Nurse Midwife

## 2017-03-26 ENCOUNTER — Ambulatory Visit: Payer: BLUE CROSS/BLUE SHIELD | Admitting: Certified Nurse Midwife

## 2017-03-26 ENCOUNTER — Other Ambulatory Visit: Payer: Self-pay

## 2017-03-26 VITALS — BP 110/70 | HR 72 | Resp 16 | Ht 62.75 in | Wt 156.0 lb

## 2017-03-26 DIAGNOSIS — Z780 Asymptomatic menopausal state: Secondary | ICD-10-CM | POA: Diagnosis not present

## 2017-03-26 DIAGNOSIS — Z01419 Encounter for gynecological examination (general) (routine) without abnormal findings: Secondary | ICD-10-CM | POA: Diagnosis not present

## 2017-03-26 NOTE — Progress Notes (Signed)
62 y.o. G0P0000 Married  Caucasian Fe here for annual exam. Menopausal no HRT. Denies vaginal bleeding or vaginal dryness. Was seen by PCP for anal itching, has resolved with cortisone cream and perineal wash. Sees PCP twice yearly for labs/aex hypertension and cholesterol medication management and sees Dr. Chalmers Cater once yearly for glucose management.  Sees Dr Annamaria Boots for pulmonary medications. No health issues today.  Patient's last menstrual period was 01/02/2001.          Sexually active: Yes.    The current method of family planning is status post hysterectomy.    Exercising: Yes.    walking Smoker:  no  Health Maintenance: Pap:  03-14-15 neg HPV HR neg History of Abnormal Pap: yes MMG:  02-16-17 category c density birads 1:neg Self Breast exams: yes Colonoscopy:  2018 f/u 68yrs BMD:   2015  TDaP:   With pcp Shingles: no Pneumonia: 2015 Hep C and HIV: hep c neg per patient Labs: if needed.   reports that  has never smoked. she has never used smokeless tobacco. She reports that she drinks alcohol. She reports that she does not use drugs.  Past Medical History:  Diagnosis Date  . Abnormal Pap smear 1990  . Allergic rhinitis, cause unspecified   . Allergy    SEASONAL  . Anemia   . Anxiety    DENIES  . Asthma   . Diabetes mellitus without complication ([place])    on insulin  . Fibroid   . GERD (gastroesophageal reflux disease)   . Hypertension    DENIES  . Polyp of nasal cavity   . STD (sexually transmitted disease)    HSV2  . Unspecified asthma(493.90)     Past Surgical History:  Procedure Laterality Date  . COLONOSCOPY    . CRYOTHERAPY  1990   no HPV changes, no dysplasia, chronic cervicitis  . LAPAROSCOPIC OVARIAN CYSTECTOMY     bilateral 2004  . ROTATOR CUFF REPAIR  2006 left shoulder  . SUPRACERVICAL ABDOMINAL HYSTERECTOMY  2002   cervix & ovaries retained  . TONSILLECTOMY      Current Outpatient Medications  Medication Sig Dispense Refill  . acyclovir (ZOVIRAX)  400 MG tablet TAKE ONE TABLET BY MOUTH DAILY 30 tablet 12  . esomeprazole (NEXIUM) 40 MG capsule Take 1 capsule (40 mg total) by mouth daily at 12 noon. 30 capsule 6  . ferrous sulfate 325 (65 FE) MG EC tablet Take 325 mg by mouth 3 (three) times a week.    . fexofenadine (ALLEGRA) 180 MG tablet Take 180 mg by mouth daily.      . fluticasone (FLONASE) 50 MCG/ACT nasal spray INSTILL 2 SPRAYS IN EACH NOSTRIL EVERY DAY 16 g 4  . Insulin Aspart (NOVOLOG [place]) Inject into the skin daily as needed.     . insulin glargine (LANTUS) 100 units/mL SOLN Inject into the skin daily. 22 units    . lisinopril (PRINIVIL,ZESTRIL) 5 MG tablet   2  . metFORMIN (GLUCOPHAGE) 1000 MG tablet Take 1,000 mg by mouth 2 (two) times daily.      . montelukast (SINGULAIR) 10 MG tablet TAKE 1 TABLET (10 MG TOTAL) BY MOUTH DAILY. 360 tablet PRN  . naphazoline (NAPHCON) 0.1 % ophthalmic solution Place 1 drop into both eyes as needed for irritation.    . naratriptan (AMERGE) 2.5 MG tablet Take 2.5 mg by mouth as needed. Take one (1) tablet at onset of headache; if returns or does not resolve, may repeat after 4 hours;  do not exceed five (5) mg in 24 hours.     . simvastatin (ZOCOR) 20 MG tablet Take 20 mg by mouth daily.      . Wheat Dextrin (BENEFIBER PO) Take by mouth.     No current facility-administered medications for this visit.     Family History  Problem Relation Age of Onset  . Asthma Sister        SEVERE CHRONIC  . Cancer Mother        bile duct liver cancer  . Thyroid disease Mother   . Cancer Father        leukemia  . Cancer Maternal Grandmother        stomach that started around rectum  . Heart attack Maternal Grandfather   . Stroke Paternal Grandmother   . Cancer Paternal Grandfather        started in kidneys    ROS:  Pertinent items are noted in HPI.  Otherwise, a comprehensive ROS was negative.  Exam:   BP 110/70   Pulse 72   Resp 16   Ht 5' 2.75" (1.594 m)   Wt 156 lb (70.8 kg)   LMP  01/02/2001 Comment: supracervical hysterectomy  BMI 27.85 kg/m  Height: 5' 2.75" (159.4 cm) Ht Readings from Last 3 Encounters:  03/26/17 5' 2.75" (1.594 m)  03/17/17 5\' 3"  (1.6 m)  06/26/16 5\' 3"  (1.6 m)    General appearance: alert, cooperative and appears stated age Head: Normocephalic, without obvious abnormality, atraumatic Neck: no adenopathy, supple, symmetrical, trachea midline and thyroid normal to inspection and palpation Lungs: clear to auscultation bilaterally Breasts: normal appearance, no masses or tenderness, No nipple retraction or dimpling, No nipple discharge or bleeding, No axillary or supraclavicular adenopathy Heart: regular rate and rhythm Abdomen: soft, non-tender; no masses,  no organomegaly Extremities: extremities normal, atraumatic, no cyanosis or edema Skin: Skin color, texture, turgor normal. No rashes or lesions Lymph nodes: Cervical, supraclavicular, and axillary nodes normal. No abnormal inguinal nodes palpated Neurologic: Grossly normal   Pelvic: External genitalia:  no lesions              Urethra:  normal appearing urethra with no masses, tenderness or lesions              Bartholin's and Skene's: normal                 Vagina: normal appearing vagina with normal color and discharge, no lesions              Cervix: no cervical motion tenderness, no lesions and normal appearance              Pap taken: No. Bimanual Exam:  Uterus:  uterus absent              Adnexa: normal adnexa and no mass, fullness, tenderness               Rectovaginal: Confirms               Anus:  normal sphincter tone, no lesions  Chaperone present: yes  A:  Well Woman with normal exam  Menopausal no HRT s/p TAH supracervical  HSV 2 history with no outbreaks, but needs medication update  Cholesterol,hypertension,glucose management pulmonary issues with MD, all stable per patient  BMD due patient will schedule    P:   Reviewed health and wellness pertinent to  exam  Aware of need to advise if vaginal bleeding or dryness  Continue follow  up with MD's regarding health issues.  Stressed importance of BMD and bone health   Pap smear: no   counseled on breast self exam, mammography screening, feminine hygiene, adequate intake of calcium and vitamin D, diet and exercise  return annually or prn  An After Visit Summary was printed and given to the patient.

## 2017-03-26 NOTE — Patient Instructions (Signed)

## 2017-04-13 ENCOUNTER — Other Ambulatory Visit: Payer: Self-pay | Admitting: Gastroenterology

## 2017-04-26 ENCOUNTER — Other Ambulatory Visit: Payer: Self-pay | Admitting: Certified Nurse Midwife

## 2017-04-26 NOTE — Telephone Encounter (Signed)
Medication refill request: acyclovir  Last AEX:  03-26-17  Next AEX: 04-01-18  Last MMG (if hormonal medication request): 02-16-17 WNL  Refill authorized: please advise

## 2017-05-12 DIAGNOSIS — E1165 Type 2 diabetes mellitus with hyperglycemia: Secondary | ICD-10-CM | POA: Diagnosis not present

## 2017-05-12 DIAGNOSIS — E7800 Pure hypercholesterolemia, unspecified: Secondary | ICD-10-CM | POA: Diagnosis not present

## 2017-05-12 DIAGNOSIS — E611 Iron deficiency: Secondary | ICD-10-CM | POA: Diagnosis not present

## 2017-05-19 DIAGNOSIS — K219 Gastro-esophageal reflux disease without esophagitis: Secondary | ICD-10-CM | POA: Diagnosis not present

## 2017-05-19 DIAGNOSIS — E7800 Pure hypercholesterolemia, unspecified: Secondary | ICD-10-CM | POA: Diagnosis not present

## 2017-05-19 DIAGNOSIS — D509 Iron deficiency anemia, unspecified: Secondary | ICD-10-CM | POA: Diagnosis not present

## 2017-05-19 DIAGNOSIS — I10 Essential (primary) hypertension: Secondary | ICD-10-CM | POA: Diagnosis not present

## 2017-06-17 DIAGNOSIS — D2239 Melanocytic nevi of other parts of face: Secondary | ICD-10-CM | POA: Diagnosis not present

## 2017-06-17 DIAGNOSIS — D485 Neoplasm of uncertain behavior of skin: Secondary | ICD-10-CM | POA: Diagnosis not present

## 2017-06-29 ENCOUNTER — Ambulatory Visit (INDEPENDENT_AMBULATORY_CARE_PROVIDER_SITE_OTHER): Payer: BLUE CROSS/BLUE SHIELD | Admitting: Internal Medicine

## 2017-06-29 ENCOUNTER — Encounter: Payer: Self-pay | Admitting: Internal Medicine

## 2017-06-29 VITALS — BP 110/72 | HR 99 | Ht 63.0 in | Wt 160.0 lb

## 2017-06-29 DIAGNOSIS — J302 Other seasonal allergic rhinitis: Secondary | ICD-10-CM | POA: Diagnosis not present

## 2017-06-29 DIAGNOSIS — J4520 Mild intermittent asthma, uncomplicated: Secondary | ICD-10-CM

## 2017-06-29 DIAGNOSIS — J3089 Other allergic rhinitis: Secondary | ICD-10-CM

## 2017-06-29 DIAGNOSIS — Z23 Encounter for immunization: Secondary | ICD-10-CM | POA: Diagnosis not present

## 2017-06-29 MED ORDER — IPRATROPIUM BROMIDE HFA 17 MCG/ACT IN AERS: 2.0000 | INHALATION_SPRAY | Freq: Four times a day (QID) | RESPIRATORY_TRACT | 12 refills | Status: DC | PRN

## 2017-06-29 NOTE — Patient Instructions (Addendum)
Order- office spirometry   Dx  Asthma mild intermittent  Script sent refilling Atrovent HFA to have available as a "rescue" inhaler, if needed for wheezing, tight chest, shortness of breath  Please call if we can help

## 2017-06-29 NOTE — Progress Notes (Signed)
Patient ID: Sonia [name], female    DOB: February 10, 1955, 62 y.o.   MRN: [id number]  HPI  female never smoker followed for asthma, allergic rhinitis/nasal polyps, complicated by DM 2, GERD, HBP  ----------------------------------------------------------------------------------------------------------  06/26/16-62 year old female never smoker followed for asthma, allergic rhinitis/nasal polyps, complicated by DM 2, GERD, HBP FOLLOW UP FOR 1 year follow for asthma not had and asthma problems   Remote resection of nasal polyp, asymptomatic since. Continues Flonase. Denies routine cough or wheeze. Breathing does not bother her sleep. Uses Atrovent rescue inhaler very occasionally, Singulair. Denies cough or hives associated with lisinopril.  06/29/2017- 63 year old female never smoker followed for asthma, allergic rhinitis/nasal polyps, complicated by DM 2, GERD, HBP ----Asthma: Pt states she has been doing well since last visit but noted slight wheezing yesterday-nothing she can think of that triggered it.  Office spirometry- 06/29/17-  WNL Note lisinopril- no routine concerns associated. Cannot tolerate stimulation from albuterol.  Atrovent HFA has worked well as a rescue inhaler for very occasional use when needed.  Feels well today.  Denies other active medical concerns.  ROS-see HPI    + = positive Constitutional:   No-   weight loss, night sweats, fevers, chills, fatigue, lassitude. HEENT:   No-  headaches, difficulty swallowing, tooth/dental problems, sore throat,      No-sneezing, itching, ear ache,+ nasal congestion, +post nasal drip,  CV:  No-   chest pain, orthopnea, PND, swelling in lower extremities, anasarca, dizziness, palpitations Resp: No-   shortness of breath with exertion or at rest.              No-   productive cough,  No non-productive cough,  No- coughing up of blood.              No-   change in color of mucus.  No- wheezing.   Skin: No-   rash or lesions. GI:  No-    heartburn, indigestion, abdominal pain, nausea, vomiting,  GU:  MS:  No-   joint pain or swelling.   Neuro-     nothing unusual Psych:  No- change in mood or affect. No depression or anxiety.  No memory loss.   OBJ- Physical Exam General- Alert, Oriented, Affect-appropriate, Distress- none acute Skin- rash-none, lesions- none, excoriation- none Lymphadenopathy- none Head- atraumatic            Eyes- Gross vision intact, PERRLA, conjunctivae and secretions clear            Ears- Hearing, canals-normal            Nose-  no-Septal dev, mucus, +polyp-superior R nare, erosion, perforation             Throat- Mallampati III , mucosa clear , drainage- none, tonsils- atrophic Neck- flexible , trachea midline, no stridor , thyroid nl, carotid no bruit Chest - symmetrical excursion , unlabored           Heart/CV- RRR , no murmur , no gallop  , no rub, nl s1 s2                           - JVD- none , edema- none, stasis changes- none, varices- none           Lung- clear to P&A, wheeze- none, cough- none , dullness-none, rub- none           Chest wall-  Abd-  Br/ Gen/ Rectal- Not done,  not indicated Extrem- cyanosis- none, clubbing, none, atrophy- none, strength- nl Neuro- grossly intact to observation

## 2017-06-29 NOTE — Assessment & Plan Note (Signed)
She has gotten through spring pollen season without significant flare.  OTC nasal steroid spray and nonsedating antihistamine remain options for her if needed.

## 2017-06-29 NOTE — Progress Notes (Signed)
spirosprio

## 2017-06-29 NOTE — Assessment & Plan Note (Signed)
Not sure what she noticed that she first woke yesterday.  We discussed ozone/air pollution levels going up with increased heat and sun exposure.  Atrovent continues to be a good rescue inhaler for her, used only rarely without nighttime wheezing. Plan-refill Atrovent HFA, continue Singulair

## 2017-07-08 DIAGNOSIS — L814 Other melanin hyperpigmentation: Secondary | ICD-10-CM | POA: Diagnosis not present

## 2017-07-08 DIAGNOSIS — D225 Melanocytic nevi of trunk: Secondary | ICD-10-CM | POA: Diagnosis not present

## 2017-07-08 DIAGNOSIS — D485 Neoplasm of uncertain behavior of skin: Secondary | ICD-10-CM | POA: Diagnosis not present

## 2017-07-08 DIAGNOSIS — D2262 Melanocytic nevi of left upper limb, including shoulder: Secondary | ICD-10-CM | POA: Diagnosis not present

## 2017-07-08 DIAGNOSIS — D2261 Melanocytic nevi of right upper limb, including shoulder: Secondary | ICD-10-CM | POA: Diagnosis not present

## 2017-07-08 DIAGNOSIS — D2272 Melanocytic nevi of left lower limb, including hip: Secondary | ICD-10-CM | POA: Diagnosis not present

## 2017-07-12 ENCOUNTER — Telehealth: Payer: Self-pay

## 2017-07-12 MED ORDER — TIOTROPIUM BROMIDE MONOHYDRATE 1.25 MCG/ACT IN AERS: 2.0000 | INHALATION_SPRAY | Freq: Every day | RESPIRATORY_TRACT | 12 refills | Status: DC

## 2017-07-12 NOTE — Telephone Encounter (Signed)
Her insurance is denying Atrovent hfa  Offer her a trial of Spiriva respimat   1.25 mg, # 1, refill x 12     Inhale 2 puffs, once daily

## 2017-07-12 NOTE — Telephone Encounter (Signed)
Called and spoke with Patient.  Patient stated that trying spiriva was fine.   Prescription sent to preferred pharmacy, [place].  Nothing further at this time.  Per CY- Her insurance is denying Atrovent hfa  Offer her a trial of Spiriva respimat   1.25 mg, # 1, refill x 12     Inhale 2 puffs, once daily

## 2017-07-12 NOTE — Telephone Encounter (Signed)
PA request received by [email or web address] for Atrovent. Key: [id number] PA was apparently sent to plan on 6/6, and was denied by insurance.  No covered alternatives listed.  Denial letter is being faxed to our office.   CY please advise if you'd like to appeal this decision, or prescribe an alternate med.  Thanks!

## 2017-07-13 ENCOUNTER — Telehealth: Payer: Self-pay | Admitting: Internal Medicine

## 2017-07-13 NOTE — Telephone Encounter (Signed)
Called patients pharmacy. Sent to voicemail after being on hold for over 5 minutes. Left detailed message with patients name and DOB. Asked to call us back.

## 2017-07-13 NOTE — Telephone Encounter (Signed)
Called patients other pharmacies in chart and they are not aware of what I am calling about. Will await last pharmacies call back.

## 2017-07-15 NOTE — Telephone Encounter (Signed)
I have left a message with [facility] after being on hold for over 5+ minutes.

## 2017-07-16 MED ORDER — LEVALBUTEROL TARTRATE 45 MCG/ACT IN AERO: 2.0000 | INHALATION_SPRAY | Freq: Four times a day (QID) | RESPIRATORY_TRACT | 12 refills | Status: DC | PRN

## 2017-07-16 NOTE — Telephone Encounter (Signed)
Ok Rx levalbuterol hfa inhaler    # 1, inhale 2 puffs every 6 hours if needed, ref x 12

## 2017-07-16 NOTE — Telephone Encounter (Signed)
Rx has been sent in per Dr. Annamaria Boots. Nothing further was needed.

## 2017-07-16 NOTE — Telephone Encounter (Signed)
I spoke with [facility]ist at [facility] and she stated the pt wanted a rescue inhaler. Per last message she cannot take albuterol because it makes her heart race. CY sent in Spiriva instead and she picked up RX but it is not a rescue inhaler so she was confused on why that was sent in. CY please advise.   The pharmacist stated we could try levalbuterol or Xopenex HFa.  Current Outpatient Medications on File Prior to Visit  Medication Sig Dispense Refill  . acyclovir (ZOVIRAX) 400 MG tablet TAKE ONE TABLET BY MOUTH DAILY 30 tablet 11  . esomeprazole (NEXIUM) 40 MG capsule TAKE ONE CAPSULE BY MOUTH DAILY AT NOON 30 capsule 5  . ferrous sulfate 325 (65 FE) MG EC tablet Take 325 mg by mouth 3 (three) times a week.    . fexofenadine (ALLEGRA) 180 MG tablet Take 180 mg by mouth daily.      . fluticasone (FLONASE) 50 MCG/ACT nasal spray INSTILL 2 SPRAYS IN EACH NOSTRIL EVERY DAY 16 g 4  . Insulin Aspart (NOVOLOG [place]) Inject into the skin daily as needed.     . insulin glargine (LANTUS) 100 units/mL SOLN Inject into the skin daily. 22 units    . ipratropium (ATROVENT HFA) 17 MCG/ACT inhaler Inhale 2 puffs into the lungs every 6 (six) hours as needed for wheezing. 1 Inhaler 12  . lisinopril (PRINIVIL,ZESTRIL) 5 MG tablet   2  . metFORMIN (GLUCOPHAGE) 1000 MG tablet Take 1,000 mg by mouth 2 (two) times daily.      . montelukast (SINGULAIR) 10 MG tablet TAKE 1 TABLET (10 MG TOTAL) BY MOUTH DAILY. 360 tablet PRN  . naphazoline (NAPHCON) 0.1 % ophthalmic solution Place 1 drop into both eyes as needed for irritation.    . naratriptan (AMERGE) 2.5 MG tablet Take 2.5 mg by mouth as needed. Take one (1) tablet at onset of headache; if returns or does not resolve, may repeat after 4 hours; do not exceed five (5) mg in 24 hours.     . simvastatin (ZOCOR) 20 MG tablet Take 20 mg by mouth daily.      . Tiotropium Bromide Monohydrate (SPIRIVA RESPIMAT) 1.25 MCG/ACT AERS Inhale 2 puffs into the lungs daily. 1 Inhaler  12  . Wheat Dextrin (BENEFIBER PO) Take by mouth.     No current facility-administered medications on file prior to visit.    Allergies  Allergen Reactions  . Ciprofloxacin   . Doxycycline     Nausea vomiting  . Flagyl [Metronidazole]     GI intolerance  . Penicillins   . Amoxicillin Swelling and Rash

## 2017-09-06 ENCOUNTER — Other Ambulatory Visit: Payer: Self-pay | Admitting: Internal Medicine

## 2017-09-06 MED ORDER — FLUTICASONE PROPIONATE 50 MCG/ACT NA SUSP: NASAL | 4 refills | Status: DC

## 2017-09-08 DIAGNOSIS — G43009 Migraine without aura, not intractable, without status migrainosus: Secondary | ICD-10-CM | POA: Diagnosis not present

## 2017-09-20 DIAGNOSIS — E1165 Type 2 diabetes mellitus with hyperglycemia: Secondary | ICD-10-CM | POA: Diagnosis not present

## 2017-09-20 DIAGNOSIS — E7800 Pure hypercholesterolemia, unspecified: Secondary | ICD-10-CM | POA: Diagnosis not present

## 2017-09-24 DIAGNOSIS — I10 Essential (primary) hypertension: Secondary | ICD-10-CM | POA: Diagnosis not present

## 2017-09-24 DIAGNOSIS — E7800 Pure hypercholesterolemia, unspecified: Secondary | ICD-10-CM | POA: Diagnosis not present

## 2017-09-24 DIAGNOSIS — E1165 Type 2 diabetes mellitus with hyperglycemia: Secondary | ICD-10-CM | POA: Diagnosis not present

## 2017-10-07 ENCOUNTER — Other Ambulatory Visit: Payer: Self-pay | Admitting: Gastroenterology

## 2017-10-14 ENCOUNTER — Other Ambulatory Visit: Payer: Self-pay | Admitting: Internal Medicine

## 2017-10-14 MED ORDER — MONTELUKAST SODIUM 10 MG PO TABS: 10.0000 mg | ORAL_TABLET | Freq: Every day | ORAL | 3 refills | Status: DC

## 2017-10-14 NOTE — Telephone Encounter (Signed)
Per CY-okay to refill as requested with 3 additional refills.

## 2017-10-29 DIAGNOSIS — Z23 Encounter for immunization: Secondary | ICD-10-CM | POA: Diagnosis not present

## 2017-11-15 DIAGNOSIS — E559 Vitamin D deficiency, unspecified: Secondary | ICD-10-CM | POA: Diagnosis not present

## 2017-11-15 DIAGNOSIS — Z0000 Encounter for general adult medical examination without abnormal findings: Secondary | ICD-10-CM | POA: Diagnosis not present

## 2017-11-22 DIAGNOSIS — E559 Vitamin D deficiency, unspecified: Secondary | ICD-10-CM | POA: Diagnosis not present

## 2017-11-22 DIAGNOSIS — Z0000 Encounter for general adult medical examination without abnormal findings: Secondary | ICD-10-CM | POA: Diagnosis not present

## 2017-11-22 DIAGNOSIS — E7800 Pure hypercholesterolemia, unspecified: Secondary | ICD-10-CM | POA: Diagnosis not present

## 2017-11-22 DIAGNOSIS — D509 Iron deficiency anemia, unspecified: Secondary | ICD-10-CM | POA: Diagnosis not present

## 2017-11-22 DIAGNOSIS — Z23 Encounter for immunization: Secondary | ICD-10-CM | POA: Diagnosis not present

## 2017-12-10 DIAGNOSIS — S90129A Contusion of unspecified lesser toe(s) without damage to nail, initial encounter: Secondary | ICD-10-CM | POA: Diagnosis not present

## 2017-12-12 ENCOUNTER — Other Ambulatory Visit: Payer: Self-pay | Admitting: Internal Medicine

## 2018-01-03 DIAGNOSIS — Z23 Encounter for immunization: Secondary | ICD-10-CM | POA: Diagnosis not present

## 2018-01-04 ENCOUNTER — Other Ambulatory Visit: Payer: Self-pay | Admitting: *Deleted

## 2018-01-04 MED ORDER — ESOMEPRAZOLE MAGNESIUM 40 MG PO CPDR: 40.0000 mg | DELAYED_RELEASE_CAPSULE | Freq: Every day | ORAL | 2 refills | Status: DC

## 2018-01-27 DIAGNOSIS — J020 Streptococcal pharyngitis: Secondary | ICD-10-CM | POA: Diagnosis not present

## 2018-02-04 DIAGNOSIS — J209 Acute bronchitis, unspecified: Secondary | ICD-10-CM | POA: Diagnosis not present

## 2018-02-07 ENCOUNTER — Encounter: Payer: Self-pay | Admitting: Gastroenterology

## 2018-02-07 ENCOUNTER — Encounter

## 2018-02-07 ENCOUNTER — Ambulatory Visit (INDEPENDENT_AMBULATORY_CARE_PROVIDER_SITE_OTHER): Payer: BLUE CROSS/BLUE SHIELD | Admitting: Gastroenterology

## 2018-02-07 VITALS — BP 124/66 | HR 71 | Ht 62.75 in | Wt 164.0 lb

## 2018-02-07 DIAGNOSIS — K644 Residual hemorrhoidal skin tags: Secondary | ICD-10-CM

## 2018-02-07 DIAGNOSIS — K648 Other hemorrhoids: Secondary | ICD-10-CM | POA: Diagnosis not present

## 2018-02-07 DIAGNOSIS — K219 Gastro-esophageal reflux disease without esophagitis: Secondary | ICD-10-CM | POA: Diagnosis not present

## 2018-02-07 DIAGNOSIS — L290 Pruritus ani: Secondary | ICD-10-CM

## 2018-02-07 MED ORDER — ESOMEPRAZOLE MAGNESIUM 40 MG PO CPDR: 40.0000 mg | DELAYED_RELEASE_CAPSULE | Freq: Every day | ORAL | 3 refills | Status: DC

## 2018-02-07 NOTE — Patient Instructions (Addendum)
HEMORRHOID  FOLLOW-UP CARE   1. Avoid strenuous exercise.  A sitz bath (soaking in a warm tub) or bidet is soothing, and can be useful for cleansing the area after bowel movements.     2. To avoid constipation, take two teaspoons three times daily of natural wheat bran, natural oat bran, flax, Benefiber or any over the counter fiber supplement and increase your water intake to 8-10 glasses daily.  Do Miralax 1 capful daily and titrate as needed to have daily soft bowel movements   3. Do not stay seated continuously for more than 2-3 hours for a day or two after the procedure.  Tighten your buttock muscles 10-15 times every two hours and take 10-15 deep breaths every 1-2 hours.  Do not spend more than a few minutes on the toilet if you cannot empty your bowel; instead re-visit the toilet at a later time  4          Use suppositories per rectum daily for 1 week as needed   Continue Balneol as needed  Always dry the perianal area and avoid moisture  We will refill Nexium   Follow up in 1 year  If you are age 1 or older, your body mass index should be between 23-30. Your Body mass index is 29.28 kg/m. If this is out of the aforementioned range listed, please consider follow up with your Primary Care Provider.  If you are age 72 or younger, your body mass index should be between 19-25. Your Body mass index is 29.28 kg/m. If this is out of the aformentioned range listed, please consider follow up with your Primary Care Provider.    Thank you for choosing [facility]  Karleen Hampshire Nand[place]am,MD

## 2018-02-07 NOTE — Progress Notes (Signed)
Sonia [name]    [id number]    03-May-1955  Primary Care Physician:Pharr, Thayer Jew, MD  Referring Physician: Deland Pretty, [place], [place] [address]  Chief complaint: Anal itching  HPI:  63 year old female with history of chronic GERD, diabetes, iron deficiency anemia here with complaints of intermittent perianal itching.  She was last seen by Nevin Bloodgood in February 2019 with similar complaints.  Colonoscopy February 27, 2016 normal exam other than small internal hemorrhoids.  Her symptoms have improved to some extent with Balneol-HC wipes.  Denies any rectal bleeding or mucus discharge.  No discomfort during defecation.  No change in bowel habits.  EGD January 2018 showed LA grade a esophagitis and gastritis  Colonoscopy January 2018 all diminutive cecal tubular adenoma was removed, internal hemorrhoids otherwise unremarkable exam   Outpatient Encounter Medications as of 02/07/2018  Medication Sig  . acyclovir (ZOVIRAX) 400 MG tablet TAKE ONE TABLET BY MOUTH DAILY  . azithromycin (ZITHROMAX Z-PAK) 250 MG tablet Take 250 mg by mouth daily.  Marland Kitchen esomeprazole (NEXIUM) 40 MG capsule Take 1 capsule (40 mg total) by mouth daily.  . ferrous sulfate 325 (65 FE) MG EC tablet Take 325 mg by mouth 3 (three) times a week.  . fexofenadine (ALLEGRA) 180 MG tablet Take 180 mg by mouth daily.    . fluticasone (FLONASE) 50 MCG/ACT nasal spray INSTILL 2 SPRAYS IN EACH NOSTRIL ONCE DAILY  . Insulin Aspart (NOVOLOG [place]) Inject into the skin daily as needed.   . insulin glargine (LANTUS) 100 units/mL SOLN Inject into the skin daily. 22 units  . ipratropium (ATROVENT HFA) 17 MCG/ACT inhaler Inhale 2 puffs into the lungs every 6 (six) hours as needed for wheezing.  . levalbuterol (XOPENEX HFA) 45 MCG/ACT inhaler Inhale 2 puffs into the lungs every 6 (six) hours as needed for wheezing.  Marland Kitchen lisinopril (PRINIVIL,ZESTRIL) 5 MG tablet   . metFORMIN (GLUCOPHAGE) 1000 MG  tablet Take 1,000 mg by mouth 2 (two) times daily.    . montelukast (SINGULAIR) 10 MG tablet Take 1 tablet (10 mg total) by mouth daily.  . naphazoline (NAPHCON) 0.1 % ophthalmic solution Place 1 drop into both eyes as needed for irritation.  . naratriptan (AMERGE) 2.5 MG tablet Take 2.5 mg by mouth as needed. Take one (1) tablet at onset of headache; if returns or does not resolve, may repeat after 4 hours; do not exceed five (5) mg in 24 hours.   . simvastatin (ZOCOR) 20 MG tablet Take 20 mg by mouth daily.    . Tiotropium Bromide Monohydrate (SPIRIVA RESPIMAT) 1.25 MCG/ACT AERS Inhale 2 puffs into the lungs daily.  . Wheat Dextrin (BENEFIBER PO) Take by mouth.   No facility-administered encounter medications on file as of 02/07/2018.     Allergies as of 02/07/2018 - Review Complete 02/07/2018  Allergen Reaction Noted  . Ciprofloxacin    . Doxycycline  04/30/2011  . Flagyl [metronidazole]  01/16/2013  . Penicillins    . Amoxicillin Swelling and Rash 01/16/2013    Past Medical History:  Diagnosis Date  . Abnormal Pap smear 1990  . Allergic rhinitis, cause unspecified   . Allergy    SEASONAL  . Anemia   . Anxiety    DENIES  . Asthma   . Diabetes mellitus without complication ([place])    on insulin  . Fibroid   . GERD (gastroesophageal reflux disease)   . Hypertension    DENIES  .  Polyp of nasal cavity   . STD (sexually transmitted disease)    HSV2  . Unspecified asthma(493.90)     Past Surgical History:  Procedure Laterality Date  . COLONOSCOPY    . CRYOTHERAPY  1990   no HPV changes, no dysplasia, chronic cervicitis  . LAPAROSCOPIC OVARIAN CYSTECTOMY     bilateral 2004  . ROTATOR CUFF REPAIR  2006 left shoulder  . SUPRACERVICAL ABDOMINAL HYSTERECTOMY  2002   cervix & ovaries retained  . TONSILLECTOMY      Family History  Problem Relation Age of Onset  . Asthma Sister        SEVERE CHRONIC  . Cancer Mother        bile duct liver cancer  . Thyroid disease Mother    . Cancer Father        leukemia  . Cancer Maternal Grandmother        stomach that started around rectum  . Heart attack Maternal Grandfather   . Stroke Paternal Grandmother   . Cancer Paternal Grandfather        started in kidneys    Social History   Socioeconomic History  . Marital status: Married    Spouse name: Not on file  . Number of children: Not on file  . Years of education: Not on file  . Highest education level: Not on file  Occupational History    Employer: [employer]  . Financial resource strain: Not on file  . Food insecurity:    Worry: Not on file    Inability: Not on file  . Transportation needs:    Medical: Not on file    Non-medical: Not on file  Tobacco Use  . Smoking status: Never Smoker  . Smokeless tobacco: Never Used  Substance and Sexual Activity  . Alcohol use: Yes    Comment: 1 a month  . Drug use: No  . Sexual activity: Yes    Partners: Male    Birth control/protection: Surgical    Comment: supracervical hysterectomy  Lifestyle  . Physical activity:    Days per week: Not on file    Minutes per session: Not on file  . Stress: Not on file  Relationships  . Social connections:    Talks on phone: Not on file    Gets together: Not on file    Attends religious service: Not on file    Active member of club or organization: Not on file    Attends meetings of clubs or organizations: Not on file    Relationship status: Not on file  . Intimate partner violence:    Fear of current or ex partner: Not on file    Emotionally abused: Not on file    Physically abused: Not on file    Forced sexual activity: Not on file  Other Topics Concern  . Not on file  Social History Narrative  . Not on file      Review of systems: Review of Systems  Constitutional: Negative for fever and chills.  HENT: Negative.   Eyes: Negative for blurred vision.  Respiratory: Negative for cough, shortness of breath and wheezing.     Cardiovascular: Negative for chest pain and palpitations.  Gastrointestinal: as per HPI Genitourinary: Negative for dysuria, urgency, frequency and hematuria.  Musculoskeletal: Negative for myalgias, back pain and joint pain.  Skin: Negative for itching and rash.  Neurological: Negative for dizziness, tremors, focal weakness, seizures and loss of consciousness.  Endo/Heme/Allergies:  Positive for seasonal allergies.  Psychiatric/Behavioral: Negative for depression, suicidal ideas and hallucinations.  All other systems reviewed and are negative.   Physical Exam: Vitals:   02/07/18 1319  BP: 124/66  Pulse: 71   Body mass index is 29.28 kg/m. Gen:      No acute distress HEENT:  EOMI, sclera anicteric Neck:     No masses; no thyromegaly Lungs:    Clear to auscultation bilaterally; normal respiratory effort CV:         Regular rate and rhythm; no murmurs Abd:      + bowel sounds; soft, non-tender; no palpable masses, no distension Ext:    No edema; adequate peripheral perfusion Skin:      Warm and dry; no rash Neuro: alert and oriented x 3 Psych: normal mood and affect Rectal exam: Normal anal sphincter tone, no anal fissure or external hemorrhoids.  Small external anal skin tag.  No perianal rash or skin changes. Anoscopy: Small internal hemorrhoids, no active bleeding, normal dentate line, no visible nodules  Data Reviewed:  Reviewed labs, radiology imaging, old records and pertinent past GI work up   Assessment and Plan/Recommendations:  63 year old female with history of tubular adenoma, chronic GERD and internal hemorrhoids here with complaints of intermittent anal itching She has itching limited mostly to the area of external anal skin tag, eyes patient to avoid rubbing or excessive hygiene Avoid excessive moisture in the perianal area, avoid applying multiple creams or moisturizers Okay to use skin barrier cream if needed Okay to use Balneol-HC wipes as needed Patient  does not want to proceed with hemorrhoidal band ligation as she has a high deductible plan and most of her symptoms are from the external skin tag which cannot be treated with the band ligation  GERD: Symptoms currently stable on Nexium 40 mg daily, continue Discussed potential risks with long-term use of Nexium Discussed antireflux measures  Return in 1 year or sooner if needed   K. Denzil Magnuson , MD [phone]    CC: Deland Pretty, MD

## 2018-03-18 DIAGNOSIS — E1165 Type 2 diabetes mellitus with hyperglycemia: Secondary | ICD-10-CM | POA: Diagnosis not present

## 2018-03-18 DIAGNOSIS — E7800 Pure hypercholesterolemia, unspecified: Secondary | ICD-10-CM | POA: Diagnosis not present

## 2018-03-18 DIAGNOSIS — I10 Essential (primary) hypertension: Secondary | ICD-10-CM | POA: Diagnosis not present

## 2018-03-25 DIAGNOSIS — I10 Essential (primary) hypertension: Secondary | ICD-10-CM | POA: Diagnosis not present

## 2018-03-25 DIAGNOSIS — D509 Iron deficiency anemia, unspecified: Secondary | ICD-10-CM | POA: Diagnosis not present

## 2018-03-25 DIAGNOSIS — E7800 Pure hypercholesterolemia, unspecified: Secondary | ICD-10-CM | POA: Diagnosis not present

## 2018-03-25 DIAGNOSIS — E1165 Type 2 diabetes mellitus with hyperglycemia: Secondary | ICD-10-CM | POA: Diagnosis not present

## 2018-04-01 ENCOUNTER — Ambulatory Visit: Payer: BLUE CROSS/BLUE SHIELD | Admitting: Certified Nurse Midwife

## 2018-04-04 DIAGNOSIS — Z23 Encounter for immunization: Secondary | ICD-10-CM | POA: Diagnosis not present

## 2018-04-08 ENCOUNTER — Other Ambulatory Visit: Payer: Self-pay

## 2018-04-08 ENCOUNTER — Ambulatory Visit (INDEPENDENT_AMBULATORY_CARE_PROVIDER_SITE_OTHER): Payer: BLUE CROSS/BLUE SHIELD | Admitting: Certified Nurse Midwife

## 2018-04-08 ENCOUNTER — Other Ambulatory Visit (HOSPITAL_COMMUNITY)
Admission: RE | Admit: 2018-04-08 | Discharge: 2018-04-08 | Disposition: A | Discharge status: Home / Self Care | Payer: BLUE CROSS/BLUE SHIELD | Source: Ambulatory Visit | Attending: Certified Nurse Midwife | Admitting: Certified Nurse Midwife

## 2018-04-08 ENCOUNTER — Encounter: Payer: Self-pay | Admitting: Certified Nurse Midwife

## 2018-04-08 VITALS — BP 110/64 | HR 68 | Resp 16 | Ht 63.25 in | Wt 168.0 lb

## 2018-04-08 DIAGNOSIS — Z780 Asymptomatic menopausal state: Secondary | ICD-10-CM | POA: Diagnosis not present

## 2018-04-08 DIAGNOSIS — Z124 Encounter for screening for malignant neoplasm of cervix: Secondary | ICD-10-CM | POA: Diagnosis not present

## 2018-04-08 DIAGNOSIS — Z01419 Encounter for gynecological examination (general) (routine) without abnormal findings: Secondary | ICD-10-CM | POA: Diagnosis not present

## 2018-04-08 NOTE — Progress Notes (Signed)
63 y.o. G0P0000 Married  Caucasian Fe here for annual exam. Sees Dr. Shelia Media  And Dr. Chalmers Cater twice yearly for diabetes, hypertension, cholesterol. Sees [facility] for asthma.Some weight gain over the holidays and working on walking now. Denies vaginal bleeding or vaginal dryness. Uses OTC coconut oil as needed for moisture. No other health issues today.  Patient's last menstrual period was 01/02/2001.          Sexually active: Yes.    The current method of family planning is status post hysterectomy.    Exercising: Yes.    walk Smoker:  no  Review of Systems  Constitutional: Negative.   HENT: Negative.   Eyes: Negative.   Respiratory: Negative.   Cardiovascular: Negative.   Gastrointestinal: Negative.   Genitourinary: Negative.   Musculoskeletal: Negative.   Skin: Negative.   Neurological: Negative.   Endo/Heme/Allergies: Negative.   Psychiatric/Behavioral: Negative.     Health Maintenance: Pap:  03-14-15 neg HPV HR neg History of Abnormal Pap: yes MMG:  02-16-17 category c density birads 1:neg Self Breast exams: yes Colonoscopy:  2018 f/u 38yrs BMD:   2015 TDaP:  With pcp Shingles: had done Pneumonia: 2015 Hep C and HIV: hep c neg per patient Labs: if needed does with PCP   reports that she has never smoked. She has never used smokeless tobacco. She reports current alcohol use. She reports that she does not use drugs.  Past Medical History:  Diagnosis Date  . Abnormal Pap smear 1990  . Allergic rhinitis, cause unspecified   . Allergy    SEASONAL  . Anemia   . Anxiety    DENIES  . Asthma   . Diabetes mellitus without complication ([place])    on insulin  . Fibroid   . GERD (gastroesophageal reflux disease)   . Hypertension    DENIES  . Polyp of nasal cavity   . STD (sexually transmitted disease)    HSV2  . Unspecified asthma(493.90)     Past Surgical History:  Procedure Laterality Date  . COLONOSCOPY    . CRYOTHERAPY  1990   no HPV changes, no dysplasia, chronic  cervicitis  . LAPAROSCOPIC OVARIAN CYSTECTOMY     bilateral 2004  . ROTATOR CUFF REPAIR  2006 left shoulder  . SUPRACERVICAL ABDOMINAL HYSTERECTOMY  2002   cervix & ovaries retained  . TONSILLECTOMY      Current Outpatient Medications  Medication Sig Dispense Refill  . acyclovir (ZOVIRAX) 400 MG tablet TAKE ONE TABLET BY MOUTH DAILY 30 tablet 11  . azithromycin (ZITHROMAX Z-PAK) 250 MG tablet Take 250 mg by mouth daily.    Marland Kitchen esomeprazole (NEXIUM) 40 MG capsule Take 1 capsule (40 mg total) by mouth daily. 90 capsule 3  . ferrous sulfate 325 (65 FE) MG EC tablet Take 325 mg by mouth 3 (three) times a week.    . fexofenadine (ALLEGRA) 180 MG tablet Take 180 mg by mouth daily.      . fluticasone (FLONASE) 50 MCG/ACT nasal spray INSTILL 2 SPRAYS IN EACH NOSTRIL ONCE DAILY 16 g 3  . Insulin Aspart (NOVOLOG [place]) Inject into the skin daily as needed.     . insulin glargine (LANTUS) 100 units/mL SOLN Inject into the skin daily. 22 units    . ipratropium (ATROVENT HFA) 17 MCG/ACT inhaler Inhale 2 puffs into the lungs every 6 (six) hours as needed for wheezing. 1 Inhaler 12  . levalbuterol (XOPENEX HFA) 45 MCG/ACT inhaler Inhale 2 puffs into the lungs every 6 (six) hours  as needed for wheezing. 1 Inhaler 12  . lisinopril (PRINIVIL,ZESTRIL) 5 MG tablet   2  . metFORMIN (GLUCOPHAGE) 1000 MG tablet Take 1,000 mg by mouth 2 (two) times daily.      . montelukast (SINGULAIR) 10 MG tablet Take 1 tablet (10 mg total) by mouth daily. 90 tablet 3  . naphazoline (NAPHCON) 0.1 % ophthalmic solution Place 1 drop into both eyes as needed for irritation.    . naratriptan (AMERGE) 2.5 MG tablet Take 2.5 mg by mouth as needed. Take one (1) tablet at onset of headache; if returns or does not resolve, may repeat after 4 hours; do not exceed five (5) mg in 24 hours.     . simvastatin (ZOCOR) 20 MG tablet Take 20 mg by mouth daily.      . Tiotropium Bromide Monohydrate (SPIRIVA RESPIMAT) 1.25 MCG/ACT AERS Inhale 2 puffs  into the lungs daily. 1 Inhaler 12  . Wheat Dextrin (BENEFIBER PO) Take by mouth.     No current facility-administered medications for this visit.     Family History  Problem Relation Age of Onset  . Asthma Sister        SEVERE CHRONIC  . Cancer Mother        bile duct liver cancer  . Thyroid disease Mother   . Cancer Father        leukemia  . Cancer Maternal Grandmother        stomach that started around rectum  . Heart attack Maternal Grandfather   . Stroke Paternal Grandmother   . Cancer Paternal Grandfather        started in kidneys    ROS:  Pertinent items are noted in HPI.  Otherwise, a comprehensive ROS was negative.  Exam:   LMP 01/02/2001 Comment: supracervical hysterectomy   Ht Readings from Last 3 Encounters:  02/07/18 5' 2.75" (1.594 m)  06/29/17 5\' 3"  (1.6 m)  03/26/17 5' 2.75" (1.594 m)    General appearance: alert, cooperative and appears stated age Head: Normocephalic, without obvious abnormality, atraumatic Neck: no adenopathy, supple, symmetrical, trachea midline and thyroid normal to inspection and palpation Lungs: clear to auscultation bilaterally Breasts: normal appearance, no masses or tenderness, No nipple retraction or dimpling, No nipple discharge or bleeding, No axillary or supraclavicular adenopathy, reduction scarring bilateral Heart: regular rate and rhythm Abdomen: soft, non-tender; no masses,  no organomegaly Extremities: extremities normal, atraumatic, no cyanosis or edema Skin: Skin color, texture, turgor normal. No rashes or lesions Lymph nodes: Cervical, supraclavicular, and axillary nodes normal. No abnormal inguinal nodes palpated Neurologic: Grossly normal   Pelvic: External genitalia:  no lesions              Urethra:  normal appearing urethra with no masses, tenderness or lesions              Bartholin's and Skene's: normal                 Vagina: normal appearing vagina with normal color and discharge, no lesions               Cervix: no cervical motion tenderness, no lesions and normal appearance              Pap taken: Yes.   Bimanual Exam:  Uterus:  uterus absent              Adnexa: normal adnexa and no mass, fullness, tenderness  Rectovaginal: Confirms               Anus:  normal sphincter tone, no lesions  Chaperone present: yes  A:  Well Woman with normal exam  Post menopausal no HRT, S/P TAH supracervical  with ovaries retained  Glucose, cholesterol, hypertension with MD management  Mammogram due, BMD due  P:   Reviewed health and wellness pertinent to exam  Discussed if has vaginal bleeding to needs to call  Continue follow up with PCP as indicated  Discussed importance of mammogram. Patient wants to wait and schedule after she starts with Medicare at end of year Stressed SBE  Discussed BMD at same time as mammogram, future order BMD placed.  Pap smear: yes   counseled on breast self exam, mammography screening, feminine hygiene, adequate intake of calcium and vitamin D, diet and exercise, Kegel's exercises  return annually or prn  An After Visit Summary was printed and given to the patient.

## 2018-04-08 NOTE — Patient Instructions (Signed)

## 2018-04-12 LAB — CYTOLOGY - PAP
Diagnosis: NEGATIVE
HPV: NOT DETECTED

## 2018-04-28 ENCOUNTER — Telehealth: Payer: Self-pay | Admitting: Internal Medicine

## 2018-04-28 MED ORDER — FLUTICASONE PROPIONATE 50 MCG/ACT NA SUSP: NASAL | 2 refills | Status: DC

## 2018-04-28 NOTE — Telephone Encounter (Signed)
Spoke with pt.  Refill for flonase sent to preferred pharmacy.  Nothing further needed.

## 2018-05-19 ENCOUNTER — Other Ambulatory Visit: Payer: Self-pay | Admitting: Certified Nurse Midwife

## 2018-05-19 NOTE — Telephone Encounter (Signed)
Medication refill request: acyclovir  Last AEX:  04/08/18 DL Next AEX: 04/12/19 DL Last MMG (if hormonal medication request): 02/16/17  Refill authorized: 04/27/17 #[id number]. Today please advise.

## 2018-05-26 DIAGNOSIS — D509 Iron deficiency anemia, unspecified: Secondary | ICD-10-CM | POA: Diagnosis not present

## 2018-05-26 DIAGNOSIS — E7800 Pure hypercholesterolemia, unspecified: Secondary | ICD-10-CM | POA: Diagnosis not present

## 2018-05-26 DIAGNOSIS — E559 Vitamin D deficiency, unspecified: Secondary | ICD-10-CM | POA: Diagnosis not present

## 2018-05-26 DIAGNOSIS — E113311 Type 2 diabetes mellitus with moderate nonproliferative diabetic retinopathy with macular edema, right eye: Secondary | ICD-10-CM | POA: Diagnosis not present

## 2018-05-31 DIAGNOSIS — E7800 Pure hypercholesterolemia, unspecified: Secondary | ICD-10-CM | POA: Diagnosis not present

## 2018-05-31 DIAGNOSIS — E1165 Type 2 diabetes mellitus with hyperglycemia: Secondary | ICD-10-CM | POA: Diagnosis not present

## 2018-05-31 DIAGNOSIS — E559 Vitamin D deficiency, unspecified: Secondary | ICD-10-CM | POA: Diagnosis not present

## 2018-05-31 DIAGNOSIS — I10 Essential (primary) hypertension: Secondary | ICD-10-CM | POA: Diagnosis not present

## 2018-06-25 DIAGNOSIS — R404 Transient alteration of awareness: Secondary | ICD-10-CM | POA: Diagnosis not present

## 2018-06-25 DIAGNOSIS — R000 Tachycardia, unspecified: Secondary | ICD-10-CM | POA: Diagnosis not present

## 2018-06-25 DIAGNOSIS — E161 Other hypoglycemia: Secondary | ICD-10-CM | POA: Diagnosis not present

## 2018-06-25 DIAGNOSIS — E162 Hypoglycemia, unspecified: Secondary | ICD-10-CM | POA: Diagnosis not present

## 2018-06-28 DIAGNOSIS — E1165 Type 2 diabetes mellitus with hyperglycemia: Secondary | ICD-10-CM | POA: Diagnosis not present

## 2018-06-28 DIAGNOSIS — E162 Hypoglycemia, unspecified: Secondary | ICD-10-CM | POA: Diagnosis not present

## 2018-06-30 ENCOUNTER — Ambulatory Visit: Payer: BLUE CROSS/BLUE SHIELD | Admitting: Internal Medicine

## 2018-07-07 ENCOUNTER — Ambulatory Visit (INDEPENDENT_AMBULATORY_CARE_PROVIDER_SITE_OTHER): Payer: BC Managed Care – PPO | Admitting: Internal Medicine

## 2018-07-07 ENCOUNTER — Other Ambulatory Visit: Payer: Self-pay

## 2018-07-07 ENCOUNTER — Encounter: Payer: Self-pay | Admitting: Internal Medicine

## 2018-07-07 VITALS — BP 100/60 | HR 97 | Temp 98.1°F | Ht 63.0 in | Wt 166.2 lb

## 2018-07-07 DIAGNOSIS — J4520 Mild intermittent asthma, uncomplicated: Secondary | ICD-10-CM | POA: Diagnosis not present

## 2018-07-07 DIAGNOSIS — J302 Other seasonal allergic rhinitis: Secondary | ICD-10-CM

## 2018-07-07 DIAGNOSIS — J3089 Other allergic rhinitis: Secondary | ICD-10-CM | POA: Diagnosis not present

## 2018-07-07 NOTE — Progress Notes (Signed)
Patient ID: Sonia [name], female    DOB: 06/14/1955, 63 y.o.   MRN: [id number]  HPI  female never smoker followed for asthma, allergic rhinitis/nasal polyps, complicated by DM 2, GERD, HBP  ----------------------------------------------------------------------------------------------------------  06/29/2017- 63 year old female never smoker followed for asthma, allergic rhinitis/nasal polyps, complicated by DM 2, GERD, HBP ----Asthma: Pt states she has been doing well since last visit but noted slight wheezing yesterday-nothing she can think of that triggered it.  Office spirometry- 06/29/17-  WNL Note lisinopril- no routine concerns associated. Cannot tolerate stimulation from albuterol.  Atrovent HFA has worked well as a rescue inhaler for very occasional use when needed.  Feels well today.  Denies other active medical concerns.  07/07/2018- 63 year old female never smoker followed for asthma, allergic rhinitis/nasal polyps, complicated by DM 2, GERD, HBP -----pt states breathing is at baseline; reports having flare-ups at the beginning of spring gradually resolved w/ use of Spiriva; as of 07/07/2018 not currently using Spiriva Intolerant of LABA stimulation. Has used Atrovent HFA for rescue in past.. Now uses Spiriva intermittently. Has Xopenex hfa rescue  ROS-see HPI    + = positive Constitutional:   No-   weight loss, night sweats, fevers, chills, fatigue, lassitude. HEENT:   No-  headaches, difficulty swallowing, tooth/dental problems, sore throat,      No-sneezing, itching, ear ache,+ nasal congestion, +post nasal drip,  CV:  No-   chest pain, orthopnea, PND, swelling in lower extremities, anasarca, dizziness, palpitations Resp: No-   shortness of breath with exertion or at rest.              No-   productive cough,  No non-productive cough,  No- coughing up of blood.              No-   change in color of mucus.  No- wheezing.   Skin: No-   rash or lesions. GI:  No-   heartburn,  indigestion, abdominal pain, nausea, vomiting,  GU:  MS:  No-   joint pain or swelling.   Neuro-     nothing unusual Psych:  No- change in mood or affect. No depression or anxiety.  No memory loss.   OBJ- Physical Exam General- Alert, Oriented, Affect-appropriate, Distress- none acute Skin- rash-none, lesions- none, excoriation- none Lymphadenopathy- none Head- atraumatic            Eyes- Gross vision intact, PERRLA, conjunctivae and secretions clear            Ears- Hearing, canals-normal            Nose-  no-Septal dev, mucus, +polyp-superior R nare, erosion, perforation             Throat- Mallampati III , mucosa clear , drainage- none, tonsils- atrophic Neck- flexible , trachea midline, no stridor , thyroid nl, carotid no bruit Chest - symmetrical excursion , unlabored           Heart/CV- RRR , no murmur , no gallop  , no rub, nl s1 s2                           - JVD- none , edema- none, stasis changes- none, varices- none           Lung- clear to P&A, wheeze- none, cough- none , dullness-none, rub- none           Chest wall-  Abd-  Br/ Gen/ Rectal- Not done, not  indicated Extrem- cyanosis- none, clubbing, none, atrophy- none, strength- nl Neuro- grossly intact to observation

## 2018-07-07 NOTE — Patient Instructions (Signed)
Ok to continue current meds  Spiriva can be used as a daily maintenance inhaler during "sick" stretches. You can also add the levalbuterol rescue inhaler up to 4 times daily any time you feel you need it.  Please call if we can help

## 2018-07-10 NOTE — Assessment & Plan Note (Signed)
Discussed seasonal use of flonase, antihistamines. Currently doing well.

## 2018-07-10 NOTE — Assessment & Plan Note (Signed)
Mild intermittent uncomplicated. Spring seasonal exacerbation- used rescue inhaler then. Educated re Spiriva as a maintenance med and levalbuterol for rescue, but ok to use Spiriva intermittently if sufficient.

## 2018-09-07 DIAGNOSIS — D2261 Melanocytic nevi of right upper limb, including shoulder: Secondary | ICD-10-CM | POA: Diagnosis not present

## 2018-09-07 DIAGNOSIS — L720 Epidermal cyst: Secondary | ICD-10-CM | POA: Diagnosis not present

## 2018-09-07 DIAGNOSIS — D2262 Melanocytic nevi of left upper limb, including shoulder: Secondary | ICD-10-CM | POA: Diagnosis not present

## 2018-09-07 DIAGNOSIS — D0371 Melanoma in situ of right lower limb, including hip: Secondary | ICD-10-CM | POA: Diagnosis not present

## 2018-09-07 DIAGNOSIS — D225 Melanocytic nevi of trunk: Secondary | ICD-10-CM | POA: Diagnosis not present

## 2018-09-09 DIAGNOSIS — E7800 Pure hypercholesterolemia, unspecified: Secondary | ICD-10-CM | POA: Diagnosis not present

## 2018-09-09 DIAGNOSIS — I10 Essential (primary) hypertension: Secondary | ICD-10-CM | POA: Diagnosis not present

## 2018-09-09 DIAGNOSIS — E1165 Type 2 diabetes mellitus with hyperglycemia: Secondary | ICD-10-CM | POA: Diagnosis not present

## 2018-09-09 DIAGNOSIS — E559 Vitamin D deficiency, unspecified: Secondary | ICD-10-CM | POA: Diagnosis not present

## 2018-09-12 DIAGNOSIS — G43009 Migraine without aura, not intractable, without status migrainosus: Secondary | ICD-10-CM | POA: Diagnosis not present

## 2018-09-16 DIAGNOSIS — E559 Vitamin D deficiency, unspecified: Secondary | ICD-10-CM | POA: Diagnosis not present

## 2018-09-16 DIAGNOSIS — E7800 Pure hypercholesterolemia, unspecified: Secondary | ICD-10-CM | POA: Diagnosis not present

## 2018-09-16 DIAGNOSIS — E1165 Type 2 diabetes mellitus with hyperglycemia: Secondary | ICD-10-CM | POA: Diagnosis not present

## 2018-09-16 DIAGNOSIS — Z23 Encounter for immunization: Secondary | ICD-10-CM | POA: Diagnosis not present

## 2018-09-16 DIAGNOSIS — I10 Essential (primary) hypertension: Secondary | ICD-10-CM | POA: Diagnosis not present

## 2018-09-19 DIAGNOSIS — L988 Other specified disorders of the skin and subcutaneous tissue: Secondary | ICD-10-CM | POA: Diagnosis not present

## 2018-09-19 DIAGNOSIS — D0371 Melanoma in situ of right lower limb, including hip: Secondary | ICD-10-CM | POA: Diagnosis not present

## 2018-09-23 ENCOUNTER — Other Ambulatory Visit: Payer: Self-pay | Admitting: *Deleted

## 2018-09-23 MED ORDER — ACYCLOVIR 400 MG PO TABS: 400.0000 mg | ORAL_TABLET | Freq: Every day | ORAL | 0 refills | Status: DC

## 2018-09-23 NOTE — Telephone Encounter (Signed)
Medication refill request: Acyclovir  Last AEX:  04-08-2018 DL  Next AEX: 04-12-19  Last MMG (if hormonal medication request): n/a  Refill authorized: Today, please advise.  Medication pended for #90, 0RF. Please refill if appropriate.

## 2018-09-29 ENCOUNTER — Ambulatory Visit: Payer: BLUE CROSS/BLUE SHIELD | Admitting: Internal Medicine

## 2018-10-04 DIAGNOSIS — Z23 Encounter for immunization: Secondary | ICD-10-CM | POA: Diagnosis not present

## 2018-10-19 ENCOUNTER — Telehealth: Payer: Self-pay | Admitting: Internal Medicine

## 2018-10-19 MED ORDER — MONTELUKAST SODIUM 10 MG PO TABS: 10.0000 mg | ORAL_TABLET | Freq: Every day | ORAL | 3 refills | Status: DC

## 2018-10-19 NOTE — Telephone Encounter (Signed)
Spoke with patient. She was checking on the status of her refill request from [facility]. Advised her that I did not see where they had sent over a request but I would go ahead and send in the refill. She verbalized understanding. RX has been sent.   Nothing further needed at time of call.

## 2018-11-18 DIAGNOSIS — I10 Essential (primary) hypertension: Secondary | ICD-10-CM | POA: Diagnosis not present

## 2018-11-18 DIAGNOSIS — E559 Vitamin D deficiency, unspecified: Secondary | ICD-10-CM | POA: Diagnosis not present

## 2018-11-18 DIAGNOSIS — E7800 Pure hypercholesterolemia, unspecified: Secondary | ICD-10-CM | POA: Diagnosis not present

## 2018-11-18 DIAGNOSIS — E1165 Type 2 diabetes mellitus with hyperglycemia: Secondary | ICD-10-CM | POA: Diagnosis not present

## 2018-12-14 ENCOUNTER — Other Ambulatory Visit: Payer: Self-pay | Admitting: Certified Nurse Midwife

## 2018-12-14 NOTE — Telephone Encounter (Signed)
Medication refill request: Acyclovir  Last AEX:  04-08-2018 DL  Next AEX: 04-12-19  Last MMG (if hormonal medication request): n/a Refill authorized: Today, please advise.   Medication pended for #90, 1 RF. Please refill if appropriate.

## 2019-01-19 ENCOUNTER — Other Ambulatory Visit: Payer: Self-pay

## 2019-01-19 NOTE — Patient Outreach (Addendum)
  [facility]) Care Management Chronic Special Needs Program  01/20/2019  Name: Sonia [name] DOB: 1955-06-16  MRN: [id number]  Ms. Sonia [name] is enrolled in a chronic special needs plan for Diabetes. Chronic Care Management Coordinator telephoned client to complete health risk assessment and to develop individualized care plan.  Introduced the chronic care management program, importance of client participation, and taking their care plan to all provider appointments and inpatient facilities.   Subjective: Client states her insurance with health team advantage will be ending 02/02/19. Client states she will have a different health plan on 02/03/2019. Client reports her most recent A1c 7.8.  Client states her doctor adjusted her Lantas medication to 20 U in May 2020.  Client reports, " I have been working really hard to bring my A1c down."  Client states she is able to obtain her medications. She reports attending her follow up visits with her doctors.   Client reports having a history of Melanoma. Client denies any further questions/ concerns at this time.   Goals Addressed            This Visit's Progress   . Client understands the importance of follow-up with providers by attending scheduled visits      . Client will verbalize knowledge of chronic lung disease as evidenced by no ED visits or Inpatient stays related to chronic lung disease       . Client will verbalize knowledge of self management of Hypertension as evidences by BP reading of 140/90 or less; or as defined by provider      . HEMOGLOBIN A1C < 7.0      . Maintain timely refills of diabetic medication as prescribed within the year .      Marland Kitchen Obtain annual  Lipid Profile, LDL-C      . Obtain Annual Eye (retinal)  Exam       . Obtain Annual Foot Exam      . Obtain annual screen for micro albuminuria (urine) , nephropathy (kidney problems)      . Obtain Hemoglobin A1C at least 2 times per year      . Visit  Primary Care Provider or Endocrinologist at least 2 times per year         Assessment: Assessment: Client is not meeting diabetes self-management goal of hemoglobin A1C of <7% with most recent reading of 7.8% as reported by client.  Client has good understanding of:  COVID-19 cause, symptoms, precautions (social distancing, stay at home order, hand washing, wear face covering when unable to maintain or ensure 6 foot social distancing), and symptoms requiring provider notification. RNCM advised patient to notify MD of any changes in condition prior to scheduled appointment. RNCM provided  24 hour nurse advise line [phone].  RNCM verified patient aware of 911 services for urgent/ emergent needs.    Plan:  Send successful outreach letter with a copy of their individualized care plan, Send individual care plan to provider and Send educational material ( High Blood pressure: What you can do.  Asthma Action plan, and Asthma: What you can do to stay Healthy  Chronic care management coordination will outreach in:  6 Months     Quinn Plowman RN,BSN,CCM [facility] [phone]

## 2019-02-08 ENCOUNTER — Other Ambulatory Visit: Payer: Self-pay

## 2019-02-08 NOTE — Patient Outreach (Signed)
  [facility]) Care Management Chronic Special Needs Program    02/08/2019  Name: CARILYN STANGE, DOB: 1955-02-19  MRN: [id number]   RNCM received notification that client has dis-enrolled from the [place] C-SNP plan therefore RNCM will no longer be able to follow as client's C-SNP Chronic Care Management Coordinator.   PLAN;  Close case and send case closure letter to primary care provider.   Quinn Plowman RN,BSN,CCM [facility] [phone]

## 2019-03-15 DIAGNOSIS — D2271 Melanocytic nevi of right lower limb, including hip: Secondary | ICD-10-CM | POA: Diagnosis not present

## 2019-03-15 DIAGNOSIS — L218 Other seborrheic dermatitis: Secondary | ICD-10-CM | POA: Diagnosis not present

## 2019-03-15 DIAGNOSIS — D2261 Melanocytic nevi of right upper limb, including shoulder: Secondary | ICD-10-CM | POA: Diagnosis not present

## 2019-03-15 DIAGNOSIS — D2262 Melanocytic nevi of left upper limb, including shoulder: Secondary | ICD-10-CM | POA: Diagnosis not present

## 2019-03-15 DIAGNOSIS — D225 Melanocytic nevi of trunk: Secondary | ICD-10-CM | POA: Diagnosis not present

## 2019-03-15 DIAGNOSIS — L814 Other melanin hyperpigmentation: Secondary | ICD-10-CM | POA: Diagnosis not present

## 2019-03-15 DIAGNOSIS — D2222 Melanocytic nevi of left ear and external auricular canal: Secondary | ICD-10-CM | POA: Diagnosis not present

## 2019-03-15 DIAGNOSIS — D224 Melanocytic nevi of scalp and neck: Secondary | ICD-10-CM | POA: Diagnosis not present

## 2019-03-15 DIAGNOSIS — Z85820 Personal history of malignant melanoma of skin: Secondary | ICD-10-CM | POA: Diagnosis not present

## 2019-03-20 ENCOUNTER — Telehealth: Payer: Self-pay | Admitting: Internal Medicine

## 2019-03-20 MED ORDER — LEVALBUTEROL TARTRATE 45 MCG/ACT IN AERO: 2.0000 | INHALATION_SPRAY | Freq: Four times a day (QID) | RESPIRATORY_TRACT | 2 refills | Status: DC | PRN

## 2019-03-20 NOTE — Telephone Encounter (Signed)
Spoke with pt. °She is needing a refill on Xopenex. °Rx has been sent in. °Nothing further was needed. °

## 2019-03-21 ENCOUNTER — Other Ambulatory Visit: Payer: Self-pay | Admitting: Internal Medicine

## 2019-03-24 DIAGNOSIS — E162 Hypoglycemia, unspecified: Secondary | ICD-10-CM | POA: Diagnosis not present

## 2019-03-24 DIAGNOSIS — I10 Essential (primary) hypertension: Secondary | ICD-10-CM | POA: Diagnosis not present

## 2019-03-24 DIAGNOSIS — E1165 Type 2 diabetes mellitus with hyperglycemia: Secondary | ICD-10-CM | POA: Diagnosis not present

## 2019-03-24 DIAGNOSIS — Z794 Long term (current) use of insulin: Secondary | ICD-10-CM | POA: Diagnosis not present

## 2019-03-24 DIAGNOSIS — E559 Vitamin D deficiency, unspecified: Secondary | ICD-10-CM | POA: Diagnosis not present

## 2019-03-24 DIAGNOSIS — E7800 Pure hypercholesterolemia, unspecified: Secondary | ICD-10-CM | POA: Diagnosis not present

## 2019-04-02 ENCOUNTER — Other Ambulatory Visit: Payer: Self-pay | Admitting: Gastroenterology

## 2019-04-12 ENCOUNTER — Ambulatory Visit: Payer: BLUE CROSS/BLUE SHIELD | Admitting: Certified Nurse Midwife

## 2019-04-14 ENCOUNTER — Ambulatory Visit: Payer: Medicare HMO | Admitting: Certified Nurse Midwife

## 2019-04-14 ENCOUNTER — Other Ambulatory Visit: Payer: Self-pay

## 2019-04-14 NOTE — Progress Notes (Deleted)
64 y.o. G0P0000 Married  Caucasian Fe here for annual exam.    Patient's last menstrual period was 01/02/2001.          Sexually active: {yes no:314532}  The current method of family planning is status post hysterectomy.    Exercising: {yes no:314532}  {types:19826} Smoker:  {YES NO:22349}  ROS  Health Maintenance: Pap:  03-14-15 neg HPV HR neg, 04-08-2018 neg HPV HR neg History of Abnormal Pap: yes MMG:  02-16-17 category c density birads 1:neg Self Breast exams: {YES NO:22349} Colonoscopy:  2018 f/u 52yrs BMD:   2015 TDaP:  With pcp Shingles: had done Pneumonia: 2015 Hep C and HIV: hep c neg per patient Labs: ***   reports that she has never smoked. She has never used smokeless tobacco. She reports current alcohol use. She reports that she does not use drugs.  Past Medical History:  Diagnosis Date  . Abnormal Pap smear 1990  . Allergic rhinitis, cause unspecified   . Allergy    SEASONAL  . Anemia   . Anxiety    DENIES  . Asthma   . Diabetes mellitus without complication ([place])    on insulin  . Fibroid   . GERD (gastroesophageal reflux disease)   . Hypertension    DENIES  . Polyp of nasal cavity   . STD (sexually transmitted disease)    HSV2  . Unspecified asthma(493.90)     Past Surgical History:  Procedure Laterality Date  . COLONOSCOPY    . CRYOTHERAPY  1990   no HPV changes, no dysplasia, chronic cervicitis  . LAPAROSCOPIC OVARIAN CYSTECTOMY     bilateral 2004  . ROTATOR CUFF REPAIR  2006 left shoulder  . SUPRACERVICAL ABDOMINAL HYSTERECTOMY  2002   cervix & ovaries retained  . TONSILLECTOMY      Current Outpatient Medications  Medication Sig Dispense Refill  . acyclovir (ZOVIRAX) 400 MG tablet TAKE ONE TABLET BY MOUTH DAILY 90 tablet 2  . esomeprazole (NEXIUM) 40 MG capsule Take 1 capsule (40 mg total) by mouth daily. *Please schedule follow up appointment* 90 capsule 0  . ferrous sulfate 325 (65 FE) MG EC tablet Take 325 mg by mouth 3 (three) times a  week.    . fexofenadine (ALLEGRA) 180 MG tablet Take 180 mg by mouth daily.      . fluticasone (FLONASE) 50 MCG/ACT nasal spray INSTILL 2 SPRAYS IN EACH NOSTRIL ONCE DAILY 48 g 1  . insulin aspart (NOVOLOG FLEXPEN) 100 UNIT/ML FlexPen 3 (three) times a day as needed. Slide in scale    . insulin glargine (LANTUS) 100 units/mL SOLN Inject into the skin daily. 24 units    . levalbuterol (XOPENEX HFA) 45 MCG/ACT inhaler Inhale 2 puffs into the lungs every 6 (six) hours as needed for wheezing. 1 Inhaler 2  . lisinopril (PRINIVIL,ZESTRIL) 5 MG tablet   2  . metFORMIN (GLUCOPHAGE) 1000 MG tablet Take 1,000 mg by mouth 2 (two) times daily.      . montelukast (SINGULAIR) 10 MG tablet Take 1 tablet (10 mg total) by mouth daily. 90 tablet 3  . naphazoline (NAPHCON) 0.1 % ophthalmic solution Place 1 drop into both eyes as needed for irritation.    . naratriptan (AMERGE) 2.5 MG tablet Take 2.5 mg by mouth as needed. Take one (1) tablet at onset of headache; if returns or does not resolve, may repeat after 4 hours; do not exceed five (5) mg in 24 hours.     . simvastatin (ZOCOR) 20  MG tablet Take 20 mg by mouth daily.      Marland Kitchen tiotropium (SPIRIVA) 18 MCG inhalation capsule Place 18 mcg into inhaler and inhale daily.    . Wheat Dextrin (BENEFIBER PO) Take by mouth.     No current facility-administered medications for this visit.    Family History  Problem Relation Age of Onset  . Asthma Sister        SEVERE CHRONIC  . Cancer Mother        bile duct liver cancer  . Thyroid disease Mother   . Cancer Father        leukemia  . Cancer Maternal Grandmother        stomach that started around rectum  . Heart attack Maternal Grandfather   . Stroke Paternal Grandmother   . Cancer Paternal Grandfather        started in kidneys    ROS:  Pertinent items are noted in HPI.  Otherwise, a comprehensive ROS was negative.  Exam:   LMP 01/02/2001 Comment: supracervical hysterectomy   Ht Readings from Last 3  Encounters:  07/07/18 5\' 3"  (1.6 m)  04/08/18 5' 3.25" (1.607 m)  02/07/18 5' 2.75" (1.594 m)    General appearance: alert, cooperative and appears stated age Head: Normocephalic, without obvious abnormality, atraumatic Neck: no adenopathy, supple, symmetrical, trachea midline and thyroid {EXAM; THYROID:18604} Lungs: clear to auscultation bilaterally Breasts: {Exam; breast:13139::"normal appearance, no masses or tenderness"} Heart: regular rate and rhythm Abdomen: soft, non-tender; no masses,  no organomegaly Extremities: extremities normal, atraumatic, no cyanosis or edema Skin: Skin color, texture, turgor normal. No rashes or lesions Lymph nodes: Cervical, supraclavicular, and axillary nodes normal. No abnormal inguinal nodes palpated Neurologic: Grossly normal   Pelvic: External genitalia:  no lesions              Urethra:  normal appearing urethra with no masses, tenderness or lesions              Bartholin's and Skene's: normal                 Vagina: normal appearing vagina with normal color and discharge, no lesions              Cervix: {exam; cervix:14595}              Pap taken: {yes no:314532} Bimanual Exam:  Uterus:  {exam; uterus:12215}              Adnexa: {exam; adnexa:12223}               Rectovaginal: Confirms               Anus:  normal sphincter tone, no lesions  Chaperone present: ***  A:  Well Woman with normal exam  P:   Reviewed health and wellness pertinent to exam  Pap smear: {YES NO:22349}  {plan; gyn:5269::"mammogram","pap smear","return annually or prn"}  An After Visit Summary was printed and given to the patient.

## 2019-04-26 ENCOUNTER — Encounter: Payer: Self-pay | Admitting: Certified Nurse Midwife

## 2019-05-19 DIAGNOSIS — D509 Iron deficiency anemia, unspecified: Secondary | ICD-10-CM | POA: Diagnosis not present

## 2019-05-19 DIAGNOSIS — E7800 Pure hypercholesterolemia, unspecified: Secondary | ICD-10-CM | POA: Diagnosis not present

## 2019-05-19 DIAGNOSIS — E1165 Type 2 diabetes mellitus with hyperglycemia: Secondary | ICD-10-CM | POA: Diagnosis not present

## 2019-05-25 DIAGNOSIS — L239 Allergic contact dermatitis, unspecified cause: Secondary | ICD-10-CM | POA: Diagnosis not present

## 2019-05-26 DIAGNOSIS — E7800 Pure hypercholesterolemia, unspecified: Secondary | ICD-10-CM | POA: Diagnosis not present

## 2019-05-26 DIAGNOSIS — E559 Vitamin D deficiency, unspecified: Secondary | ICD-10-CM | POA: Diagnosis not present

## 2019-05-26 DIAGNOSIS — D509 Iron deficiency anemia, unspecified: Secondary | ICD-10-CM | POA: Diagnosis not present

## 2019-05-26 DIAGNOSIS — E1165 Type 2 diabetes mellitus with hyperglycemia: Secondary | ICD-10-CM | POA: Diagnosis not present

## 2019-05-26 DIAGNOSIS — I10 Essential (primary) hypertension: Secondary | ICD-10-CM | POA: Diagnosis not present

## 2019-05-26 DIAGNOSIS — Z1212 Encounter for screening for malignant neoplasm of rectum: Secondary | ICD-10-CM | POA: Diagnosis not present

## 2019-05-26 DIAGNOSIS — Z01419 Encounter for gynecological examination (general) (routine) without abnormal findings: Secondary | ICD-10-CM | POA: Diagnosis not present

## 2019-05-29 ENCOUNTER — Other Ambulatory Visit: Payer: Self-pay | Admitting: Internal Medicine

## 2019-05-29 DIAGNOSIS — Z1231 Encounter for screening mammogram for malignant neoplasm of breast: Secondary | ICD-10-CM

## 2019-06-05 ENCOUNTER — Other Ambulatory Visit: Payer: Self-pay

## 2019-06-05 ENCOUNTER — Ambulatory Visit
Admission: RE | Admit: 2019-06-05 | Discharge: 2019-06-05 | Disposition: A | Discharge status: Home / Self Care | Payer: Medicare HMO | Source: Ambulatory Visit | Attending: Internal Medicine | Admitting: Internal Medicine

## 2019-06-05 DIAGNOSIS — Z1231 Encounter for screening mammogram for malignant neoplasm of breast: Secondary | ICD-10-CM

## 2019-06-28 ENCOUNTER — Other Ambulatory Visit: Payer: Self-pay | Admitting: Gastroenterology

## 2019-07-07 ENCOUNTER — Other Ambulatory Visit: Payer: Self-pay

## 2019-07-07 ENCOUNTER — Ambulatory Visit: Payer: Medicare HMO | Admitting: Internal Medicine

## 2019-07-07 ENCOUNTER — Encounter: Payer: Self-pay | Admitting: Internal Medicine

## 2019-07-07 DIAGNOSIS — J4520 Mild intermittent asthma, uncomplicated: Secondary | ICD-10-CM | POA: Diagnosis not present

## 2019-07-07 DIAGNOSIS — J3089 Other allergic rhinitis: Secondary | ICD-10-CM | POA: Diagnosis not present

## 2019-07-07 DIAGNOSIS — J302 Other seasonal allergic rhinitis: Secondary | ICD-10-CM | POA: Diagnosis not present

## 2019-07-07 DIAGNOSIS — J330 Polyp of nasal cavity: Secondary | ICD-10-CM

## 2019-07-07 MED ORDER — FLUTICASONE PROPIONATE 50 MCG/ACT NA SUSP: 2.0000 | Freq: Every day | NASAL | 3 refills | Status: DC

## 2019-07-07 MED ORDER — MONTELUKAST SODIUM 10 MG PO TABS: 10.0000 mg | ORAL_TABLET | Freq: Every day | ORAL | 3 refills | Status: DC

## 2019-07-07 MED ORDER — LEVALBUTEROL TARTRATE 45 MCG/ACT IN AERO: 2.0000 | INHALATION_SPRAY | Freq: Four times a day (QID) | RESPIRATORY_TRACT | 12 refills | Status: DC | PRN

## 2019-07-07 MED ORDER — TIOTROPIUM BROMIDE MONOHYDRATE 18 MCG IN CAPS: 18.0000 ug | ORAL_CAPSULE | Freq: Every day | RESPIRATORY_TRACT | 12 refills | Status: DC

## 2019-07-07 NOTE — Assessment & Plan Note (Signed)
Well controlled, avoiding LABAs which overstimulate Plan- meds refilled and discussed

## 2019-07-07 NOTE — Progress Notes (Signed)
Patient ID: Sonia [name], female    DOB: August 06, 1955, 64 y.o.   MRN: [id number]  HPI  female never smoker followed for asthma, allergic rhinitis/nasal polyps, complicated by DM 2, GERD, HBP Office spirometry- 06/29/17-  WNL -----------------------------------------------------------------------------------------------------  07/07/2018- 64 year old female never smoker followed for asthma, allergic rhinitis/nasal polyps, complicated by DM 2, GERD, HBP -----pt states breathing is at baseline; reports having flare-ups at the beginning of spring gradually resolved w/ use of Spiriva; as of 07/07/2018 not currently using Spiriva Intolerant of LABA stimulation. Has used Atrovent HFA for rescue in past.. Now uses Spiriva intermittently. Has Xopenex hfa rescue  07/07/19- 64 year old female never smoker followed for asthma, allergic rhinitis/nasal polyps, complicated by DM 2, GERD, HBP Spiriva handihaler, Xopenex hfa, Singulair      Intolerant LABA stimulation -----no cough/ wheezing/ SOB  Had 2 Phizer Covax Describes minor increased chest tightness and wheeze 2 weeks ago, uncertain trigger. Resolved with little intervention. Uses inhalers only occasionally. Reviewed meds for refill.  Discussed Covid precautions. She has been very careful. Nose ok, doesn't suspect polyp recurrence.  ROS-see HPI    + = positive Constitutional:   No-   weight loss, night sweats, fevers, chills, fatigue, lassitude. HEENT:   No-  headaches, difficulty swallowing, tooth/dental problems, sore throat,      No-sneezing, itching, ear ache,+ nasal congestion, +post nasal drip,  CV:  No-   chest pain, orthopnea, PND, swelling in lower extremities, anasarca, dizziness, palpitations Resp: No-   shortness of breath with exertion or at rest.              No-   productive cough,  No non-productive cough,  No- coughing up of blood.              No-   change in color of mucus.  No- wheezing.   Skin: No-   rash or lesions. GI:  No-    heartburn, indigestion, abdominal pain, nausea, vomiting,  GU:  MS:  No-   joint pain or swelling.   Neuro-     nothing unusual Psych:  No- change in mood or affect. No depression or anxiety.  No memory loss.   OBJ- Physical Exam General- Alert, Oriented, Affect-appropriate, Distress- none acute Skin- rash-none, lesions- none, excoriation- none Lymphadenopathy- none Head- atraumatic            Eyes- Gross vision intact, PERRLA, conjunctivae and secretions clear            Ears- Hearing, canals-normal            Nose-  no-Septal dev, mucus, polyps, erosion, perforation             Throat- Mallampati III , mucosa clear , drainage- none, tonsils- atrophic Neck- flexible , trachea midline, no stridor , thyroid nl, carotid no bruit Chest - symmetrical excursion , unlabored           Heart/CV- RRR , no murmur , no gallop  , no rub, nl s1 s2                           - JVD- none , edema- none, stasis changes- none, varices- none           Lung- clear to P&A, wheeze- none, cough- none , dullness-none, rub- none           Chest wall-  Abd-  Br/ Gen/ Rectal- Not done, not indicated Extrem- cyanosis-  none, clubbing, none, atrophy- none, strength- nl Neuro- grossly intact to observation

## 2019-07-07 NOTE — Assessment & Plan Note (Signed)
Questionable area R nare, for observation. She has Flonase.

## 2019-07-07 NOTE — Patient Instructions (Signed)
Med refills sent  Please call if we can help

## 2019-07-07 NOTE — Assessment & Plan Note (Signed)
Comfortable control Plan- Flonase refilled

## 2019-09-13 DIAGNOSIS — D225 Melanocytic nevi of trunk: Secondary | ICD-10-CM | POA: Diagnosis not present

## 2019-09-13 DIAGNOSIS — D2262 Melanocytic nevi of left upper limb, including shoulder: Secondary | ICD-10-CM | POA: Diagnosis not present

## 2019-09-13 DIAGNOSIS — L814 Other melanin hyperpigmentation: Secondary | ICD-10-CM | POA: Diagnosis not present

## 2019-09-13 DIAGNOSIS — D1801 Hemangioma of skin and subcutaneous tissue: Secondary | ICD-10-CM | POA: Diagnosis not present

## 2019-09-13 DIAGNOSIS — L603 Nail dystrophy: Secondary | ICD-10-CM | POA: Diagnosis not present

## 2019-09-13 DIAGNOSIS — D2261 Melanocytic nevi of right upper limb, including shoulder: Secondary | ICD-10-CM | POA: Diagnosis not present

## 2019-09-13 DIAGNOSIS — L813 Cafe au lait spots: Secondary | ICD-10-CM | POA: Diagnosis not present

## 2019-09-13 DIAGNOSIS — D485 Neoplasm of uncertain behavior of skin: Secondary | ICD-10-CM | POA: Diagnosis not present

## 2019-09-13 DIAGNOSIS — D2271 Melanocytic nevi of right lower limb, including hip: Secondary | ICD-10-CM | POA: Diagnosis not present

## 2019-09-13 DIAGNOSIS — Z85820 Personal history of malignant melanoma of skin: Secondary | ICD-10-CM | POA: Diagnosis not present

## 2019-09-18 ENCOUNTER — Telehealth: Payer: Self-pay | Admitting: Internal Medicine

## 2019-09-18 MED ORDER — TIOTROPIUM BROMIDE MONOHYDRATE 18 MCG IN CAPS: 18.0000 ug | ORAL_CAPSULE | Freq: Every day | RESPIRATORY_TRACT | 12 refills | Status: DC

## 2019-09-18 NOTE — Telephone Encounter (Signed)
Refill of Spiriva sent to preferred pharmacy as requested, message left for patient.

## 2019-09-19 ENCOUNTER — Other Ambulatory Visit: Payer: Self-pay | Admitting: Internal Medicine

## 2019-09-19 MED ORDER — SPIRIVA RESPIMAT 2.5 MCG/ACT IN AERS: INHALATION_SPRAY | RESPIRATORY_TRACT | 12 refills | Status: DC

## 2019-09-20 DIAGNOSIS — E7800 Pure hypercholesterolemia, unspecified: Secondary | ICD-10-CM | POA: Diagnosis not present

## 2019-09-20 DIAGNOSIS — E1165 Type 2 diabetes mellitus with hyperglycemia: Secondary | ICD-10-CM | POA: Diagnosis not present

## 2019-09-20 DIAGNOSIS — E162 Hypoglycemia, unspecified: Secondary | ICD-10-CM | POA: Diagnosis not present

## 2019-09-20 DIAGNOSIS — I10 Essential (primary) hypertension: Secondary | ICD-10-CM | POA: Diagnosis not present

## 2019-09-20 DIAGNOSIS — E113311 Type 2 diabetes mellitus with moderate nonproliferative diabetic retinopathy with macular edema, right eye: Secondary | ICD-10-CM | POA: Diagnosis not present

## 2019-10-13 DIAGNOSIS — L603 Nail dystrophy: Secondary | ICD-10-CM | POA: Diagnosis not present

## 2019-10-13 DIAGNOSIS — L601 Onycholysis: Secondary | ICD-10-CM | POA: Diagnosis not present

## 2019-11-22 DIAGNOSIS — L814 Other melanin hyperpigmentation: Secondary | ICD-10-CM | POA: Diagnosis not present

## 2019-11-22 DIAGNOSIS — D2261 Melanocytic nevi of right upper limb, including shoulder: Secondary | ICD-10-CM | POA: Diagnosis not present

## 2019-11-22 DIAGNOSIS — Z85820 Personal history of malignant melanoma of skin: Secondary | ICD-10-CM | POA: Diagnosis not present

## 2019-11-22 DIAGNOSIS — L821 Other seborrheic keratosis: Secondary | ICD-10-CM | POA: Diagnosis not present

## 2019-11-22 DIAGNOSIS — D2272 Melanocytic nevi of left lower limb, including hip: Secondary | ICD-10-CM | POA: Diagnosis not present

## 2019-11-22 DIAGNOSIS — D225 Melanocytic nevi of trunk: Secondary | ICD-10-CM | POA: Diagnosis not present

## 2019-11-22 DIAGNOSIS — D2271 Melanocytic nevi of right lower limb, including hip: Secondary | ICD-10-CM | POA: Diagnosis not present

## 2019-11-22 DIAGNOSIS — D1801 Hemangioma of skin and subcutaneous tissue: Secondary | ICD-10-CM | POA: Diagnosis not present

## 2019-11-22 DIAGNOSIS — L812 Freckles: Secondary | ICD-10-CM | POA: Diagnosis not present

## 2019-11-24 DIAGNOSIS — E1165 Type 2 diabetes mellitus with hyperglycemia: Secondary | ICD-10-CM | POA: Diagnosis not present

## 2019-11-24 DIAGNOSIS — E7800 Pure hypercholesterolemia, unspecified: Secondary | ICD-10-CM | POA: Diagnosis not present

## 2019-11-24 DIAGNOSIS — Z0000 Encounter for general adult medical examination without abnormal findings: Secondary | ICD-10-CM | POA: Diagnosis not present

## 2019-11-24 DIAGNOSIS — D509 Iron deficiency anemia, unspecified: Secondary | ICD-10-CM | POA: Diagnosis not present

## 2019-11-24 DIAGNOSIS — I10 Essential (primary) hypertension: Secondary | ICD-10-CM | POA: Diagnosis not present

## 2019-11-24 DIAGNOSIS — E559 Vitamin D deficiency, unspecified: Secondary | ICD-10-CM | POA: Diagnosis not present

## 2019-12-01 DIAGNOSIS — E875 Hyperkalemia: Secondary | ICD-10-CM | POA: Diagnosis not present

## 2019-12-01 DIAGNOSIS — Z0000 Encounter for general adult medical examination without abnormal findings: Secondary | ICD-10-CM | POA: Diagnosis not present

## 2019-12-01 DIAGNOSIS — E7800 Pure hypercholesterolemia, unspecified: Secondary | ICD-10-CM | POA: Diagnosis not present

## 2019-12-01 DIAGNOSIS — E113311 Type 2 diabetes mellitus with moderate nonproliferative diabetic retinopathy with macular edema, right eye: Secondary | ICD-10-CM | POA: Diagnosis not present

## 2019-12-01 DIAGNOSIS — E559 Vitamin D deficiency, unspecified: Secondary | ICD-10-CM | POA: Diagnosis not present

## 2019-12-01 DIAGNOSIS — I10 Essential (primary) hypertension: Secondary | ICD-10-CM | POA: Diagnosis not present

## 2019-12-01 DIAGNOSIS — E1165 Type 2 diabetes mellitus with hyperglycemia: Secondary | ICD-10-CM | POA: Diagnosis not present

## 2019-12-01 DIAGNOSIS — Z23 Encounter for immunization: Secondary | ICD-10-CM | POA: Diagnosis not present

## 2019-12-01 DIAGNOSIS — D509 Iron deficiency anemia, unspecified: Secondary | ICD-10-CM | POA: Diagnosis not present

## 2019-12-13 ENCOUNTER — Encounter: Payer: Self-pay | Admitting: Gastroenterology

## 2019-12-13 ENCOUNTER — Ambulatory Visit: Payer: Medicare HMO | Admitting: Gastroenterology

## 2019-12-13 VITALS — BP 120/68 | HR 88 | Ht 63.0 in | Wt 153.6 lb

## 2019-12-13 DIAGNOSIS — K644 Residual hemorrhoidal skin tags: Secondary | ICD-10-CM

## 2019-12-13 DIAGNOSIS — E875 Hyperkalemia: Secondary | ICD-10-CM | POA: Diagnosis not present

## 2019-12-13 MED ORDER — HYDROCORTISONE (PERIANAL) 2.5 % EX CREA: 1.0000 "application " | TOPICAL_CREAM | Freq: Two times a day (BID) | CUTANEOUS | 1 refills | Status: AC

## 2019-12-13 NOTE — Progress Notes (Signed)
Sonia [name]    [id number]    23-Sep-1955  Primary Care Physician:Pharr, Thayer Jew, MD  Referring Physician: Deland Pretty, [address] [place],  [place] [address]   Chief complaint: Hemorrhoids  HPI:  64 year old very pleasant female with history of chronic GERD, diabetes, iron deficiency anemia here for follow-up visit with complaints of protrusion and bulge in the anorectal area  She noticed a bulge in her rectum 2 months ago that has decreased in size, denies any rectal pain or bleeding.  Her bowel habits have improved with Benefiber.  She takes it 1-3 times daily as needed.  Denies any constipation or diarrhea.  Reflux symptoms are stable.  Denies any dysphagia, odynophagia, vomiting or unintentional weight loss.  Colonoscopy February 28, 2016: Repeat exam due to inadequate bowel prep, was normal other than small internal hemorrhoids. Colonoscopy January 25,2018: Poor bowel prep with incomplete visualization of mucosa.  2 mm  cecal tubular adenoma was removed, internal hemorrhoids otherwise unremarkable exam   EGD January 2018 showed LA grade a esophagitis and gastritis     Outpatient Encounter Medications as of 12/13/2019  Medication Sig  . acyclovir (ZOVIRAX) 400 MG tablet TAKE ONE TABLET BY MOUTH DAILY  . esomeprazole (NEXIUM) 40 MG capsule Take 1 capsule (40 mg total) by mouth daily. *Please schedule follow up appointment*  . ferrous sulfate 325 (65 FE) MG EC tablet Take 325 mg by mouth 3 (three) times a week.  . fexofenadine (ALLEGRA) 180 MG tablet Take 180 mg by mouth daily.    . fluticasone (FLONASE) 50 MCG/ACT nasal spray Place 2 sprays into both nostrils daily.  . insulin aspart (NOVOLOG FLEXPEN) 100 UNIT/ML FlexPen 3 (three) times a day as needed. Slide in scale  . insulin glargine (LANTUS) 100 units/mL SOLN Inject into the skin daily. 24 units  . levalbuterol (XOPENEX HFA) 45 MCG/ACT inhaler Inhale 2 puffs into the lungs every  6 (six) hours as needed for wheezing.  Marland Kitchen lisinopril (PRINIVIL,ZESTRIL) 5 MG tablet   . metFORMIN (GLUCOPHAGE) 1000 MG tablet Take 1,000 mg by mouth 2 (two) times daily.    . montelukast (SINGULAIR) 10 MG tablet Take 1 tablet (10 mg total) by mouth daily.  . naphazoline (NAPHCON) 0.1 % ophthalmic solution Place 1 drop into both eyes as needed for irritation.  . naratriptan (AMERGE) 2.5 MG tablet Take 2.5 mg by mouth as needed. Take one (1) tablet at onset of headache; if returns or does not resolve, may repeat after 4 hours; do not exceed five (5) mg in 24 hours.   . simvastatin (ZOCOR) 20 MG tablet Take 20 mg by mouth daily.    . Tiotropium Bromide Monohydrate (SPIRIVA RESPIMAT) 2.5 MCG/ACT AERS Inhale 2 puffs daily  . Wheat Dextrin (BENEFIBER PO) Take by mouth.   No facility-administered encounter medications on file as of 12/13/2019.    Allergies as of 12/13/2019 - Review Complete 07/07/2019  Allergen Reaction Noted  . Ciprofloxacin    . Doxycycline  04/30/2011  . Flagyl [metronidazole]  01/16/2013  . Penicillins    . Amoxicillin Swelling and Rash 01/16/2013    Past Medical History:  Diagnosis Date  . Abnormal Pap smear 1990  . Allergic rhinitis, cause unspecified   . Allergy    SEASONAL  . Anemia   . Anxiety    DENIES  . Asthma   . Diabetes mellitus without complication ([place])    on insulin  . Fibroid   .  GERD (gastroesophageal reflux disease)   . Hypertension    DENIES  . Polyp of nasal cavity   . STD (sexually transmitted disease)    HSV2  . Unspecified asthma(493.90)     Past Surgical History:  Procedure Laterality Date  . COLONOSCOPY    . CRYOTHERAPY  1990   no HPV changes, no dysplasia, chronic cervicitis  . LAPAROSCOPIC OVARIAN CYSTECTOMY     bilateral 2004  . ROTATOR CUFF REPAIR  2006 left shoulder  . SUPRACERVICAL ABDOMINAL HYSTERECTOMY  2002   cervix & ovaries retained  . TONSILLECTOMY      Family History  Problem Relation Age of Onset  . Asthma  Sister        SEVERE CHRONIC  . Cancer Mother        bile duct liver cancer  . Thyroid disease Mother   . Cancer Father        leukemia  . Cancer Maternal Grandmother        stomach that started around rectum  . Heart attack Maternal Grandfather   . Stroke Paternal Grandmother   . Cancer Paternal Grandfather        started in kidneys    Social History   Socioeconomic History  . Marital status: Married    Spouse name: Not on file  . Number of children: Not on file  . Years of education: Not on file  . Highest education level: Not on file  Occupational History    Employer: [employer]  Tobacco Use  . Smoking status: Never Smoker  . Smokeless tobacco: Never Used  Vaping Use  . Vaping Use: Never used  Substance and Sexual Activity  . Alcohol use: Yes    Comment: 1 a month  . Drug use: No  . Sexual activity: Yes    Partners: Male    Birth control/protection: Surgical    Comment: supracervical hysterectomy  Other Topics Concern  . Not on file  Social History Narrative  . Not on file   Social Determinants of Health   Financial Resource Strain:   . Difficulty of Paying Living Expenses: Not on file  Food Insecurity: No Food Insecurity  . Worried About Charity fundraiser in the Last Year: Never true  . Ran Out of Food in the Last Year: Never true  Transportation Needs: No Transportation Needs  . Lack of Transportation (Medical): No  . Lack of Transportation (Non-Medical): No  Physical Activity:   . Days of Exercise per Week: Not on file  . Minutes of Exercise per Session: Not on file  Stress:   . Feeling of Stress : Not on file  Social Connections:   . Frequency of Communication with Friends and Family: Not on file  . Frequency of Social Gatherings with Friends and Family: Not on file  . Attends Religious Services: Not on file  . Active Member of Clubs or Organizations: Not on file  . Attends Archivist Meetings: Not on file  . Marital Status:  Not on file  Intimate Partner Violence:   . Fear of Current or Ex-Partner: Not on file  . Emotionally Abused: Not on file  . Physically Abused: Not on file  . Sexually Abused: Not on file      Review of systems: All other review of systems negative except as mentioned in the HPI.   Physical Exam: Vitals:   12/13/19 0857  BP: 120/68  Pulse: 88   Body mass index is 27.21  kg/m. Gen:      No acute distress HEENT:  sclera anicteric Abd:      soft, non-tender; no palpable masses, no distension Ext:    No edema Neuro: alert and oriented x 3 Psych: normal mood and affect Rectal exam: Normal anal sphincter tone, small skin tag and external hemorrhoids in the right anterior position Anoscopy: Small grade 1 internal hemorrhoids, no active bleeding, normal dentate line, no visible nodules   Data Reviewed:  Reviewed labs, radiology imaging, old records and pertinent past GI work up   Assessment and Plan/Recommendations:  64 year old very pleasant female with chronic GERD, history of adenomatous colon polyps and hemorrhoids   She has small external hemorrhoid with skin tag in the right anterior position and grade 1 internal hemorrhoids   Advised patient to use small pea-sized amount of Anusol cream per rectum on the external hemorrhoid as needed twice daily  Avoid excessive straining during defecation  Continue Benefiber 1 tablespoon 2-3 times daily  Given its not causing any rectal discomfort and is small in size, do not recommend surgery or hemorrhoidectomy.  It is not amenable to hemorrhoidal band ligation.  GERD: Symptoms currently stable on Nexium 40 mg daily, continue antireflux measures   Return in 1 year or sooner if needed  This visit required 30 minutes of patient care (this includes precharting, chart review, review of results, face-to-face time used for counseling as well as treatment plan and follow-up. The patient was provided an opportunity to ask questions and  all were answered. The patient agreed with the plan and demonstrated an understanding of the instructions.  Damaris Hippo , MD    CC: Deland Pretty, MD

## 2019-12-13 NOTE — Patient Instructions (Signed)
We have sent Anusol Cream to your pharmacy  Take Benefiber 1 tablespoon twice a day  We will try and obtain your recent labs from [facility]  Follow up in 1 year  If you are age 66 or older, your body mass index should be between 23-30. Your Body mass index is 27.21 kg/m. If this is out of the aforementioned range listed, please consider follow up with your Primary Care Provider.  If you are age 83 or younger, your body mass index should be between 19-25. Your Body mass index is 27.21 kg/m. If this is out of the aformentioned range listed, please consider follow up with your Primary Care Provider.    I appreciate the  opportunity to care for you  Thank You   Harl Bowie , MD

## 2020-02-01 DIAGNOSIS — Z0100 Encounter for examination of eyes and vision without abnormal findings: Secondary | ICD-10-CM | POA: Diagnosis not present

## 2020-03-01 ENCOUNTER — Other Ambulatory Visit: Payer: Self-pay

## 2020-03-01 ENCOUNTER — Encounter: Payer: Self-pay | Admitting: Physician Assistant

## 2020-03-01 ENCOUNTER — Ambulatory Visit (INDEPENDENT_AMBULATORY_CARE_PROVIDER_SITE_OTHER): Payer: HMO | Admitting: Physician Assistant

## 2020-03-01 VITALS — BP 121/75 | HR 92 | Ht 63.0 in | Wt 153.0 lb

## 2020-03-01 DIAGNOSIS — G43009 Migraine without aura, not intractable, without status migrainosus: Secondary | ICD-10-CM

## 2020-03-01 MED ORDER — NARATRIPTAN HCL 2.5 MG PO TABS: 2.5000 mg | ORAL_TABLET | ORAL | 6 refills | Status: DC | PRN

## 2020-03-01 NOTE — Patient Instructions (Signed)

## 2020-03-01 NOTE — Progress Notes (Signed)
History:  Sonia [name] is a 64 y.o.  who presents to clinic today for headache evaluation - new to this office and provider.  She had been seeing Dr. Joretta Bachelor who has recently moved out of state (last visit in October 2021).  She has long seen headache specialists.  Her migraines have been ongoing for decades and she notes they have improved with time.  She notes they are well controlled at this time.  She simply wanted to establish care here. The migraines can be moderate to severe.  It gets to be the whole head.  There is throbbing.  Worse with movement.  Denies nausea/vomiting.  It begins with a strain or small pain in the eye/retrorbital.   She takes ibuprofen at first sign of headache.  Amerge is used for migraine.  She states she is able to receive 18 tablets of amerge for a single co-pay and would like to continue that.  She does not use nearly that many in a month.   She has no cardiovascular issues.  She has recently started a B complex vitamin and wonders if that has helped.  Also iron, biotin and vitamin D.  HIT6:54 Number of days in the last 4 weeks with:  Severe headache: 0 Moderate headache: 2 Mild headache: 1  No headache: 25   Past Medical History:  Diagnosis Date  . Abnormal Pap smear 1990  . Allergic rhinitis, cause unspecified   . Allergy    SEASONAL  . Anemia   . Anxiety    DENIES  . Asthma   . Diabetes mellitus without complication ([place])    on insulin  . Fibroid   . GERD (gastroesophageal reflux disease)   . Hypertension    DENIES  . Polyp of nasal cavity   . STD (sexually transmitted disease)    HSV2  . Unspecified asthma(493.90)     Social History   Socioeconomic History  . Marital status: Married    Spouse name: Not on file  . Number of children: Not on file  . Years of education: Not on file  . Highest education level: Not on file  Occupational History    Employer: [employer]  Tobacco Use  . Smoking status: Never Smoker  . Smokeless  tobacco: Never Used  Vaping Use  . Vaping Use: Never used  Substance and Sexual Activity  . Alcohol use: Yes    Comment: 1 a month  . Drug use: No  . Sexual activity: Yes    Partners: Male    Birth control/protection: Surgical    Comment: supracervical hysterectomy  Other Topics Concern  . Not on file  Social History Narrative  . Not on file   Social Determinants of Health   Financial Resource Strain: Not on file  Food Insecurity: Not on file  Transportation Needs: Not on file  Physical Activity: Not on file  Stress: Not on file  Social Connections: Not on file  Intimate Partner Violence: Not on file    Family History  Problem Relation Age of Onset  . Asthma Sister        SEVERE CHRONIC  . Cancer Mother        bile duct liver cancer  . Thyroid disease Mother   . Cancer Father        leukemia  . Cancer Maternal Grandmother        stomach that started around rectum  . Heart attack Maternal Grandfather   . Stroke Paternal Grandmother   .  Cancer Paternal Grandfather        started in kidneys    Allergies  Allergen Reactions  . Ciprofloxacin   . Doxycycline     Nausea vomiting  . Flagyl [Metronidazole]     GI intolerance  . Penicillins   . Amoxicillin Swelling and Rash    Current Outpatient Medications on File Prior to Visit  Medication Sig Dispense Refill  . acyclovir (ZOVIRAX) 400 MG tablet TAKE ONE TABLET BY MOUTH DAILY 90 tablet 2  . Biotin w/ Vitamins C & E (HAIR/SKIN/NAILS PO) Take 1 tablet by mouth daily.    . Cholecalciferol (VITAMIN D3 PO) Take 1 tablet by mouth daily.    Marland Kitchen esomeprazole (NEXIUM) 40 MG capsule Take 1 capsule (40 mg total) by mouth daily. *Please schedule follow up appointment* 90 capsule 0  . ferrous sulfate 325 (65 FE) MG EC tablet Take 325 mg by mouth 3 (three) times a week.    . fexofenadine (ALLEGRA) 180 MG tablet Take 180 mg by mouth daily.    . fluticasone (FLONASE) 50 MCG/ACT nasal spray Place 2 sprays into both nostrils daily.  48 g 3  . hydrocortisone (ANUSOL-HC) 2.5 % rectal cream Place 1 application rectally 2 (two) times daily. 30 g 1  . insulin aspart (NOVOLOG) 100 UNIT/ML FlexPen 3 (three) times a day as needed. Slide in scale    . insulin glargine (LANTUS) 100 units/mL SOLN Inject into the skin daily. 24 units    . levalbuterol (XOPENEX HFA) 45 MCG/ACT inhaler Inhale 2 puffs into the lungs every 6 (six) hours as needed for wheezing. 1 Inhaler 12  . lisinopril (PRINIVIL,ZESTRIL) 5 MG tablet   2  . metFORMIN (GLUCOPHAGE) 1000 MG tablet Take 1,000 mg by mouth 2 (two) times daily.    . montelukast (SINGULAIR) 10 MG tablet Take 1 tablet (10 mg total) by mouth daily. 90 tablet 3  . naphazoline (NAPHCON) 0.1 % ophthalmic solution Place 1 drop into both eyes as needed for irritation.    . naratriptan (AMERGE) 2.5 MG tablet Take 2.5 mg by mouth as needed. Take one (1) tablet at onset of headache; if returns or does not resolve, may repeat after 4 hours; do not exceed five (5) mg in 24 hours.    . simvastatin (ZOCOR) 20 MG tablet Take 20 mg by mouth daily.    . Tiotropium Bromide Monohydrate (SPIRIVA RESPIMAT) 2.5 MCG/ACT AERS Inhale 2 puffs daily 4 g 12  . Wheat Dextrin (BENEFIBER PO) Take by mouth.     No current facility-administered medications on file prior to visit.     Review of Systems:  All pertinent positive/negative included in HPI, all other review of systems are negative   Objective:  Physical Exam BP 121/75   Pulse 92   Ht 5\' 3"  (1.6 m)   Wt 153 lb (69.4 kg)   LMP 01/02/2001 Comment: supracervical hysterectomy  BMI 27.10 kg/m  CONSTITUTIONAL: Well-developed, well-nourished female in no acute distress.  EYES: EOM intact ENT: Normocephalic CARDIOVASCULAR: Regular rate RESPIRATORY: Normal rate. MUSCULOSKELETAL: Normal ROM SKIN: Warm, dry without erythema  NEUROLOGICAL: Alert, oriented, CN II-XII grossly intact, Appropriate balance PSYCH: Normal behavior, mood   Assessment & Plan:   Assessment: Migraine without aura, stable  Plan: Continue amerge for acute migraine - with ibuprofen as needed Maintain schedule with exercise/healthy eating habits and appropriate supplements. Continue care with PCP/endocrine Call if anything changes Follow-up in 12 months or sooner PRN  Paticia Stack, PA-C 03/01/2020 8:21  AM

## 2020-03-20 DIAGNOSIS — E1165 Type 2 diabetes mellitus with hyperglycemia: Secondary | ICD-10-CM | POA: Diagnosis not present

## 2020-03-20 DIAGNOSIS — E113311 Type 2 diabetes mellitus with moderate nonproliferative diabetic retinopathy with macular edema, right eye: Secondary | ICD-10-CM | POA: Diagnosis not present

## 2020-03-20 DIAGNOSIS — E7800 Pure hypercholesterolemia, unspecified: Secondary | ICD-10-CM | POA: Diagnosis not present

## 2020-03-25 DIAGNOSIS — E1165 Type 2 diabetes mellitus with hyperglycemia: Secondary | ICD-10-CM | POA: Diagnosis not present

## 2020-03-25 DIAGNOSIS — E7800 Pure hypercholesterolemia, unspecified: Secondary | ICD-10-CM | POA: Diagnosis not present

## 2020-03-25 DIAGNOSIS — I10 Essential (primary) hypertension: Secondary | ICD-10-CM | POA: Diagnosis not present

## 2020-05-22 DIAGNOSIS — D2272 Melanocytic nevi of left lower limb, including hip: Secondary | ICD-10-CM | POA: Diagnosis not present

## 2020-05-22 DIAGNOSIS — L814 Other melanin hyperpigmentation: Secondary | ICD-10-CM | POA: Diagnosis not present

## 2020-05-22 DIAGNOSIS — L821 Other seborrheic keratosis: Secondary | ICD-10-CM | POA: Diagnosis not present

## 2020-05-22 DIAGNOSIS — D2271 Melanocytic nevi of right lower limb, including hip: Secondary | ICD-10-CM | POA: Diagnosis not present

## 2020-05-22 DIAGNOSIS — D485 Neoplasm of uncertain behavior of skin: Secondary | ICD-10-CM | POA: Diagnosis not present

## 2020-05-22 DIAGNOSIS — D225 Melanocytic nevi of trunk: Secondary | ICD-10-CM | POA: Diagnosis not present

## 2020-05-22 DIAGNOSIS — Z85820 Personal history of malignant melanoma of skin: Secondary | ICD-10-CM | POA: Diagnosis not present

## 2020-05-22 DIAGNOSIS — D235 Other benign neoplasm of skin of trunk: Secondary | ICD-10-CM | POA: Diagnosis not present

## 2020-05-24 DIAGNOSIS — E7800 Pure hypercholesterolemia, unspecified: Secondary | ICD-10-CM | POA: Diagnosis not present

## 2020-05-24 DIAGNOSIS — E113311 Type 2 diabetes mellitus with moderate nonproliferative diabetic retinopathy with macular edema, right eye: Secondary | ICD-10-CM | POA: Diagnosis not present

## 2020-05-24 DIAGNOSIS — D509 Iron deficiency anemia, unspecified: Secondary | ICD-10-CM | POA: Diagnosis not present

## 2020-05-31 DIAGNOSIS — D509 Iron deficiency anemia, unspecified: Secondary | ICD-10-CM | POA: Diagnosis not present

## 2020-05-31 DIAGNOSIS — K219 Gastro-esophageal reflux disease without esophagitis: Secondary | ICD-10-CM | POA: Diagnosis not present

## 2020-05-31 DIAGNOSIS — E7800 Pure hypercholesterolemia, unspecified: Secondary | ICD-10-CM | POA: Diagnosis not present

## 2020-05-31 DIAGNOSIS — E113311 Type 2 diabetes mellitus with moderate nonproliferative diabetic retinopathy with macular edema, right eye: Secondary | ICD-10-CM | POA: Diagnosis not present

## 2020-05-31 DIAGNOSIS — I10 Essential (primary) hypertension: Secondary | ICD-10-CM | POA: Diagnosis not present

## 2020-06-03 ENCOUNTER — Other Ambulatory Visit: Payer: Self-pay | Admitting: Internal Medicine

## 2020-06-03 DIAGNOSIS — E7800 Pure hypercholesterolemia, unspecified: Secondary | ICD-10-CM

## 2020-06-19 ENCOUNTER — Ambulatory Visit
Admission: RE | Admit: 2020-06-19 | Discharge: 2020-06-19 | Disposition: A | Discharge status: Home / Self Care | Payer: No Typology Code available for payment source | Source: Ambulatory Visit | Attending: Internal Medicine | Admitting: Internal Medicine

## 2020-06-19 DIAGNOSIS — E785 Hyperlipidemia, unspecified: Secondary | ICD-10-CM | POA: Diagnosis not present

## 2020-06-19 DIAGNOSIS — E7800 Pure hypercholesterolemia, unspecified: Secondary | ICD-10-CM

## 2020-07-04 DIAGNOSIS — H52221 Regular astigmatism, right eye: Secondary | ICD-10-CM | POA: Diagnosis not present

## 2020-07-04 DIAGNOSIS — E119 Type 2 diabetes mellitus without complications: Secondary | ICD-10-CM | POA: Diagnosis not present

## 2020-07-04 DIAGNOSIS — R519 Headache, unspecified: Secondary | ICD-10-CM | POA: Diagnosis not present

## 2020-07-04 DIAGNOSIS — H2513 Age-related nuclear cataract, bilateral: Secondary | ICD-10-CM | POA: Diagnosis not present

## 2020-07-04 DIAGNOSIS — H524 Presbyopia: Secondary | ICD-10-CM | POA: Diagnosis not present

## 2020-07-04 DIAGNOSIS — H5202 Hypermetropia, left eye: Secondary | ICD-10-CM | POA: Diagnosis not present

## 2020-07-08 ENCOUNTER — Ambulatory Visit: Payer: Medicare HMO | Admitting: Internal Medicine

## 2020-07-16 ENCOUNTER — Encounter (HOSPITAL_BASED_OUTPATIENT_CLINIC_OR_DEPARTMENT_OTHER): Payer: Self-pay | Admitting: Obstetrics & Gynecology

## 2020-07-16 ENCOUNTER — Other Ambulatory Visit: Payer: Self-pay

## 2020-07-16 ENCOUNTER — Ambulatory Visit (INDEPENDENT_AMBULATORY_CARE_PROVIDER_SITE_OTHER): Payer: HMO | Admitting: Obstetrics & Gynecology

## 2020-07-16 VITALS — BP 118/76 | HR 93 | Ht 63.0 in | Wt 151.0 lb

## 2020-07-16 DIAGNOSIS — Z8742 Personal history of other diseases of the female genital tract: Secondary | ICD-10-CM | POA: Diagnosis not present

## 2020-07-16 DIAGNOSIS — Z9889 Other specified postprocedural states: Secondary | ICD-10-CM

## 2020-07-16 DIAGNOSIS — E2839 Other primary ovarian failure: Secondary | ICD-10-CM

## 2020-07-16 DIAGNOSIS — Z1231 Encounter for screening mammogram for malignant neoplasm of breast: Secondary | ICD-10-CM | POA: Diagnosis not present

## 2020-07-16 DIAGNOSIS — Z90711 Acquired absence of uterus with remaining cervical stump: Secondary | ICD-10-CM | POA: Diagnosis not present

## 2020-07-16 DIAGNOSIS — A609 Anogenital herpesviral infection, unspecified: Secondary | ICD-10-CM | POA: Diagnosis not present

## 2020-07-16 MED ORDER — ACYCLOVIR 400 MG PO TABS: 400.0000 mg | ORAL_TABLET | Freq: Two times a day (BID) | ORAL | 3 refills | Status: DC

## 2020-07-16 NOTE — Progress Notes (Signed)
Patient ID: Sonia [name], female    DOB: 1955-05-11, 65 y.o.   MRN: [id number]  HPI  female never smoker followed for asthma, allergic rhinitis/nasal polyps, complicated by DM 2, GERD, HBP Office spirometry- 06/29/17-  WNL -----------------------------------------------------------------------------------------------------   07/07/19- 65 year old female never smoker followed for asthma, allergic rhinitis/nasal polyps, complicated by DM 2, GERD, HBP Spiriva handihaler, Xopenex hfa, Singulair      Intolerant LABA stimulation -----no cough/ wheezing/ SOB  Had 2 Phizer Covax Describes minor increased chest tightness and wheeze 2 weeks ago, uncertain trigger. Resolved with little intervention. Uses inhalers only occasionally. Reviewed meds for refill.  Discussed Covid precautions. She has been very careful. Nose ok, doesn't suspect polyp recurrence.  07/17/20- 64 yoF never smoker followed for Asthma, Allergic Rhinitis/Nasal Polyps, complicated by DM 2, GERD, HBP, CAD,  -Spiriva handihaler, Xopenex hfa, Singulair , Allegra,     Intolerant LABA stimulation -----no cough/ wheezing/ SOB  Covid vax- 2 Phizer ------Doing good now, was occass. Wheezing earlier in [place] Meds reviewed. Breathing ok with no exacerbation. Discussed cardiac CT. CT cardiac calcium scoring 06/19/20- lung looked ok.  CAD noted.   ROS-see HPI    + = positive Constitutional:   No-   weight loss, night sweats, fevers, chills, fatigue, lassitude. HEENT:   No-  headaches, difficulty swallowing, tooth/dental problems, sore throat,      No-sneezing, itching, ear ache,+ nasal congestion, +post nasal drip,  CV:  No-   chest pain, orthopnea, PND, swelling in lower extremities, anasarca, dizziness, palpitations Resp: No-   shortness of breath with exertion or at rest.              No-   productive cough,  No non-productive cough,  No- coughing up of blood.              No-   change in color of mucus.  No- wheezing.   Skin: No-    rash or lesions. GI:  No-   heartburn, indigestion, abdominal pain, nausea, vomiting,  GU:  MS:  No-   joint pain or swelling.   Neuro-     nothing unusual Psych:  No- change in mood or affect. No depression or anxiety.  No memory loss.   OBJ- Physical Exam General- Alert, Oriented, Affect-appropriate, Distress- none acute Skin- rash-none, lesions- none, excoriation- none Lymphadenopathy- none Head- atraumatic            Eyes- Gross vision intact, PERRLA, conjunctivae and secretions clear            Ears- Hearing, canals-normal            Nose-  no-Septal dev, mucus, polyps, erosion, perforation             Throat- Mallampati III , mucosa clear , drainage- none, tonsils- atrophic Neck- flexible , trachea midline, no stridor , thyroid nl, carotid no bruit Chest - symmetrical excursion , unlabored           Heart/CV- RRR , no murmur , no gallop  , no rub, nl s1 s2                           - JVD- none , edema- none, stasis changes- none, varices- none           Lung- clear to P&A, wheeze- none, cough- none , dullness-none, rub- none           Chest wall-  Abd-  Br/ Gen/ Rectal- Not done, not indicated Extrem- cyanosis- none, clubbing, none, atrophy- none, strength- nl Neuro- grossly intact to observation

## 2020-07-16 NOTE — Progress Notes (Signed)
65 y.o. G0P0000 Married White or Caucasian female here new patient exam.  Desires gyn exam.  Was followed by [facility].  Denies vaginal bleeding.  On HRT for very short while in the past after hysterectomy.  Hysterectomy was supracervical.    H/o diabetes.  Followed by Dr. Chalmers Cater.    Denies vaginal bleeding.  H/o HSV.  Takes acyclovir for suppressive therapy.  Uses uncommon dosage.  Discussed with pt typical dosing and how to take with outbreaks.  Patient's last menstrual period was 01/02/2001.          Sexually active: Yes.    H/O STD:  yes, h/o hxs  Health Maintenance: PCP:  Dr. Shelia Media.  Last wellness appt was 11/2019.  Did blood work at that appt:   Vaccines are up to date:  yes Colonoscopy:  2018, Dr. Silverio Decamp.  Follow up 5 years. MMG:  06/2019 BMD:  ordered Last pap smear:  04/2018 negative.   H/o abnormal pap smear:  h/o cryo in mid 30's    reports that she has never smoked. She has never used smokeless tobacco. She reports current alcohol use. She reports that she does not use drugs.  Past Medical History:  Diagnosis Date   Abnormal Pap smear 1990   Allergic rhinitis, cause unspecified    Allergy    SEASONAL   Anemia    Anxiety    DENIES   Asthma    Diabetes mellitus without complication ([place])    on insulin   Fibroid    GERD (gastroesophageal reflux disease)    Hypertension    DENIES   Polyp of nasal cavity    Skin cancer (melanoma) ([place])    STD (sexually transmitted disease)    HSV2   Unspecified asthma(493.90)     Past Surgical History:  Procedure Laterality Date   COLONOSCOPY     CRYOTHERAPY  1990   no HPV changes, no dysplasia, chronic cervicitis   LAPAROSCOPIC OVARIAN CYSTECTOMY     bilateral 2004   MELANOMA EXCISION WITH SENTINEL LYMPH NODE BIOPSY     left leg   ROTATOR CUFF REPAIR  2006 left shoulder   SUPRACERVICAL ABDOMINAL HYSTERECTOMY  2002   cervix & ovaries retained   TONSILLECTOMY      Current Outpatient Medications  Medication Sig  Dispense Refill   acyclovir (ZOVIRAX) 400 MG tablet TAKE ONE TABLET BY MOUTH DAILY 90 tablet 2   Biotin w/ Vitamins C & E (HAIR/SKIN/NAILS PO) Take 1 tablet by mouth daily.     Cholecalciferol (VITAMIN D3 PO) Take 1 tablet by mouth daily.     esomeprazole (NEXIUM) 40 MG capsule Take 1 capsule (40 mg total) by mouth daily. *Please schedule follow up appointment* 90 capsule 0   ferrous sulfate 325 (65 FE) MG EC tablet Take 325 mg by mouth 3 (three) times a week.     fexofenadine (ALLEGRA) 180 MG tablet Take 180 mg by mouth daily.     fluticasone (FLONASE) 50 MCG/ACT nasal spray Place 2 sprays into both nostrils daily. 48 g 3   hydrocortisone (ANUSOL-HC) 2.5 % rectal cream Place 1 application rectally 2 (two) times daily. 30 g 1   insulin aspart (NOVOLOG) 100 UNIT/ML FlexPen 3 (three) times a day as needed. Slide in scale     insulin glargine (LANTUS) 100 units/mL SOLN Inject into the skin daily. 24 units     levalbuterol (XOPENEX HFA) 45 MCG/ACT inhaler Inhale 2 puffs into the lungs every 6 (six) hours as needed  for wheezing. 1 Inhaler 12   lisinopril (PRINIVIL,ZESTRIL) 5 MG tablet   2   metFORMIN (GLUCOPHAGE) 1000 MG tablet Take 1,000 mg by mouth 2 (two) times daily.     montelukast (SINGULAIR) 10 MG tablet Take 1 tablet (10 mg total) by mouth daily. 90 tablet 3   naratriptan (AMERGE) 2.5 MG tablet Take 1 tablet (2.5 mg total) by mouth as needed. Take one (1) tablet at onset of headache; if returns or does not resolve, may repeat after 4 hours; do not exceed five (5) mg in 24 hours. 18 tablet 6   simvastatin (ZOCOR) 20 MG tablet Take 20 mg by mouth daily.     Tiotropium Bromide Monohydrate (SPIRIVA RESPIMAT) 2.5 MCG/ACT AERS Inhale 2 puffs daily 4 g 12   Wheat Dextrin (BENEFIBER PO) Take by mouth.     naphazoline (NAPHCON) 0.1 % ophthalmic solution Place 1 drop into both eyes as needed for irritation. (Patient not taking: Reported on 07/16/2020)     No current facility-administered medications for  this visit.    Family History  Problem Relation Age of Onset   Asthma Sister        SEVERE CHRONIC   Cancer Mother        bile duct liver cancer   Thyroid disease Mother    Cancer Father        leukemia   Cancer Maternal Grandmother        stomach that started around rectum   Heart attack Maternal Grandfather    Stroke Paternal Grandmother    Cancer Paternal Grandfather        started in kidneys    Review of Systems  Constitutional: Negative.   Gastrointestinal: Negative.   Genitourinary: Negative.   Psychiatric/Behavioral: Negative.     Exam:   BP 118/76   Pulse 93   Ht 5\' 3"  (1.6 m)   Wt 151 lb (68.5 kg)   LMP 01/02/2001 Comment: supracervical hysterectomy  BMI 26.75 kg/m   Height: 5\' 3"  (160 cm)  General appearance: alert, cooperative and appears stated age Breasts: normal appearance, no masses or tenderness Abdomen: soft, non-tender; bowel sounds normal; no masses,  no organomegaly Lymph nodes: Cervical, supraclavicular, and axillary nodes normal.  No abnormal inguinal nodes palpated Neurologic: Grossly normal  Pelvic: External genitalia:  no lesions              Urethra:  normal appearing urethra with no masses, tenderness or lesions              Bartholins and Skenes: normal                 Vagina: normal appearing vagina with atrophic changes and no discharge, no lesions              Cervix: no lesions              Pap taken: No. Bimanual Exam:  Uterus:  uterus absent              Adnexa: no mass, fullness, tenderness               Rectovaginal: Confirms               Anus:  normal sphincter tone, no lesions  Chaperone, Octaviano Batty, CMA, was present for exam.  Assessment/Plan: 1. HSV (herpes simplex virus) anogenital infection - acyclovir (ZOVIRAX) 400 MG tablet; Take 1 tablet (400 mg total) by mouth 2 (two) times daily. Increase to 2  tablets (800mg ) three times daily for 5 days with symptoms.  Dispense: 180 tablet; Refill: 3  2. History of abnormal  cervical Pap smear - last pap 04/2018 neg with neg HR HPV.  Guidelines reviewed.  Pt desires to continue until 1.  3. Hypoestrogenism - DG BONE DENSITY (DXA); Future  4. Encounter for screening mammogram for malignant neoplasm of breast - MM 3D SCREEN BREAST BILATERAL; Future  5. History of cryosurgery  6.  Health maintenance - Pap as per above - MMG and BMD ordered - colonoscopy 2018 with Dr. Silverio Decamp.  Follow up 5 years. - lab work done with Dr. Shelia Media 11/2019.  - vaccines updated

## 2020-07-17 ENCOUNTER — Encounter: Payer: Self-pay | Admitting: Internal Medicine

## 2020-07-17 ENCOUNTER — Ambulatory Visit: Payer: HMO | Admitting: Internal Medicine

## 2020-07-17 DIAGNOSIS — J4520 Mild intermittent asthma, uncomplicated: Secondary | ICD-10-CM | POA: Diagnosis not present

## 2020-07-17 DIAGNOSIS — J330 Polyp of nasal cavity: Secondary | ICD-10-CM

## 2020-07-17 MED ORDER — MONTELUKAST SODIUM 10 MG PO TABS: 10.0000 mg | ORAL_TABLET | Freq: Every day | ORAL | 3 refills | Status: DC

## 2020-07-17 NOTE — Patient Instructions (Signed)
Montelukast refilled  We can continue current meds. Please call us if they aren't helping.  Good luck with the cardiology issues- hope you get a good report.

## 2020-07-18 ENCOUNTER — Ambulatory Visit: Payer: Medicare HMO

## 2020-07-18 ENCOUNTER — Ambulatory Visit (INDEPENDENT_AMBULATORY_CARE_PROVIDER_SITE_OTHER): Payer: HMO | Admitting: Sports Medicine

## 2020-07-18 ENCOUNTER — Encounter: Payer: Self-pay | Admitting: Sports Medicine

## 2020-07-18 ENCOUNTER — Other Ambulatory Visit: Payer: Self-pay

## 2020-07-18 DIAGNOSIS — Z9889 Other specified postprocedural states: Secondary | ICD-10-CM | POA: Insufficient documentation

## 2020-07-18 DIAGNOSIS — E119 Type 2 diabetes mellitus without complications: Secondary | ICD-10-CM

## 2020-07-18 DIAGNOSIS — A609 Anogenital herpesviral infection, unspecified: Secondary | ICD-10-CM | POA: Insufficient documentation

## 2020-07-18 NOTE — Progress Notes (Signed)
Subjective: Sonia [name] is a 65 y.o. female patient with history of diabetes who presents to office today for a diabetic foot exam.  Patient reports that a few months ago she did have slight redness and swelling at the left hallux nail but it went away states that this happened 1 other episode a long time ago states that she used to have a history of some nail fungus but slowly that has gotten better and goes routinely for pedicures.  Patient denies any other pedal complaints at this time.  A1c not recorded.  Patient Active Problem List   Diagnosis Date Noted   HSV (herpes simplex virus) anogenital infection 07/18/2020   History of cryosurgery 07/18/2020   Migraine without aura 04/07/2012   AODM 04/27/2008   NASAL POLYP 04/27/2007   Seasonal and perennial allergic rhinitis 04/27/2007   Allergic asthma, mild intermittent, uncomplicated 35/57/3220   Current Outpatient Medications on File Prior to Visit  Medication Sig Dispense Refill   acyclovir (ZOVIRAX) 400 MG tablet Take 1 tablet (400 mg total) by mouth 2 (two) times daily. Increase to 2 tablets (800mg ) three times daily for 5 days with symptoms. 180 tablet 3   Biotin w/ Vitamins C & E (HAIR/SKIN/NAILS PO) Take 1 tablet by mouth daily.     Cholecalciferol (VITAMIN D3 PO) Take 1 tablet by mouth daily. (Patient not taking: Reported on 07/17/2020)     esomeprazole (NEXIUM) 40 MG capsule Take 1 capsule (40 mg total) by mouth daily. *Please schedule follow up appointment* 90 capsule 0   ferrous sulfate 325 (65 FE) MG EC tablet Take 325 mg by mouth 3 (three) times a week.     fexofenadine (ALLEGRA) 180 MG tablet Take 180 mg by mouth daily.     fluticasone (FLONASE) 50 MCG/ACT nasal spray Place 2 sprays into both nostrils daily. 48 g 3   hydrocortisone (ANUSOL-HC) 2.5 % rectal cream Place 1 application rectally 2 (two) times daily. (Patient not taking: Reported on 07/17/2020) 30 g 1   insulin aspart (NOVOLOG) 100 UNIT/ML FlexPen 3 (three) times  a day as needed. Slide in scale     insulin glargine (LANTUS) 100 units/mL SOLN Inject into the skin daily. 24 units     levalbuterol (XOPENEX HFA) 45 MCG/ACT inhaler Inhale 2 puffs into the lungs every 6 (six) hours as needed for wheezing. 1 Inhaler 12   lisinopril (PRINIVIL,ZESTRIL) 5 MG tablet Take 5 mg by mouth daily.  2   metFORMIN (GLUCOPHAGE) 1000 MG tablet Take 1,000 mg by mouth 2 (two) times daily.     montelukast (SINGULAIR) 10 MG tablet Take 1 tablet (10 mg total) by mouth daily. 90 tablet 3   naphazoline (NAPHCON) 0.1 % ophthalmic solution Place 1 drop into both eyes as needed for irritation. (Patient not taking: No sig reported)     naratriptan (AMERGE) 2.5 MG tablet Take 1 tablet (2.5 mg total) by mouth as needed. Take one (1) tablet at onset of headache; if returns or does not resolve, may repeat after 4 hours; do not exceed five (5) mg in 24 hours. 18 tablet 6   simvastatin (ZOCOR) 20 MG tablet Take 20 mg by mouth daily.     Tiotropium Bromide Monohydrate (SPIRIVA RESPIMAT) 2.5 MCG/ACT AERS Inhale 2 puffs daily (Patient taking differently: Inhale 2 puffs daily as needed) 4 g 12   Wheat Dextrin (BENEFIBER PO) Take by mouth.     No current facility-administered medications on file prior to visit.   Allergies  Allergen Reactions   Ciprofloxacin    Doxycycline     Nausea vomiting   Flagyl [Metronidazole]     GI intolerance   Penicillins    Amoxicillin Swelling and Rash    No results found for this or any previous visit (from the past 2160 hour(s)).  Objective: General: Patient is awake, alert, and oriented x 3 and in no acute distress.  Integument: Skin is warm, dry and supple bilateral. Nails are polished and well manicured. No open lesions or preulcerative lesions present bilateral. Remaining integument unremarkable.  Vasculature:  Dorsalis Pedis pulse 2/4 bilateral. Posterior Tibial pulse  2/4 bilateral.  Capillary fill time <3 sec 1-5 bilateral. Positive hair growth  to the level of the digits. Temperature gradient within normal limits. No varicosities present bilateral. No edema present bilateral.   Neurology: The patient has intact sensation measured with a 5.07/10g Semmes Weinstein Monofilament at all pedal sites bilateral . Vibratory sensation intact bilateral with tuning fork. No Babinski sign present bilateral.   Musculoskeletal: No symptomatic pedal deformities noted bilateral. Muscular strength 5/5 in all lower extremity muscular groups bilateral without pain on range of motion . No tenderness with calf compression bilateral.  Assessment and Plan: Problem List Items Addressed This Visit   None Visit Diagnoses     Diabetes mellitus without complication ([place])    -  Primary   Encounter for diabetic foot exam ([place])            -Examined patient. -Discussed and educated patient on diabetic foot care, especially with  regards to the vascular, neurological and musculoskeletal systems.  -Stressed the importance of good glycemic control and the detriment of not  controlling glucose levels in relation to the foot. -All patient's nails are well manicured -Patient is a low risk diabetic and should return annually for foot exam advised patient if she has difficulty with her pedicures providing nail care then she may routinely turn to our office for nail care however at this time since patient has a good pedicurist there is no acute needs except for an exam annually unless symptoms change or worsen -Answered all patient questions -Patient to return yearly for diabetic foot exam -Patient advised to call the office if any problems or questions arise in the meantime.  Landis Martins, DPM

## 2020-07-25 ENCOUNTER — Other Ambulatory Visit: Payer: Self-pay

## 2020-07-25 ENCOUNTER — Ambulatory Visit (HOSPITAL_BASED_OUTPATIENT_CLINIC_OR_DEPARTMENT_OTHER)
Admission: RE | Admit: 2020-07-25 | Discharge: 2020-07-25 | Disposition: A | Discharge status: Home / Self Care | Payer: HMO | Source: Ambulatory Visit | Attending: Obstetrics & Gynecology | Admitting: Obstetrics & Gynecology

## 2020-07-25 ENCOUNTER — Ambulatory Visit (HOSPITAL_BASED_OUTPATIENT_CLINIC_OR_DEPARTMENT_OTHER): Payer: HMO | Admitting: Obstetrics & Gynecology

## 2020-07-25 DIAGNOSIS — Z780 Asymptomatic menopausal state: Secondary | ICD-10-CM | POA: Diagnosis not present

## 2020-07-25 DIAGNOSIS — Z1231 Encounter for screening mammogram for malignant neoplasm of breast: Secondary | ICD-10-CM | POA: Diagnosis not present

## 2020-07-25 DIAGNOSIS — E2839 Other primary ovarian failure: Secondary | ICD-10-CM | POA: Insufficient documentation

## 2020-07-25 DIAGNOSIS — M8589 Other specified disorders of bone density and structure, multiple sites: Secondary | ICD-10-CM | POA: Diagnosis not present

## 2020-09-09 ENCOUNTER — Other Ambulatory Visit: Payer: Self-pay | Admitting: Internal Medicine

## 2020-09-11 DIAGNOSIS — L570 Actinic keratosis: Secondary | ICD-10-CM | POA: Diagnosis not present

## 2020-09-19 DIAGNOSIS — H9312 Tinnitus, left ear: Secondary | ICD-10-CM | POA: Diagnosis not present

## 2020-09-23 DIAGNOSIS — I10 Essential (primary) hypertension: Secondary | ICD-10-CM | POA: Diagnosis not present

## 2020-09-23 DIAGNOSIS — E1165 Type 2 diabetes mellitus with hyperglycemia: Secondary | ICD-10-CM | POA: Diagnosis not present

## 2020-09-23 DIAGNOSIS — E7800 Pure hypercholesterolemia, unspecified: Secondary | ICD-10-CM | POA: Diagnosis not present

## 2020-10-02 ENCOUNTER — Encounter: Payer: Self-pay | Admitting: Internal Medicine

## 2020-10-02 ENCOUNTER — Ambulatory Visit (INDEPENDENT_AMBULATORY_CARE_PROVIDER_SITE_OTHER): Payer: HMO | Admitting: Internal Medicine

## 2020-10-02 ENCOUNTER — Other Ambulatory Visit: Payer: Self-pay

## 2020-10-02 VITALS — BP 110/70 | HR 74 | Ht 63.0 in | Wt 151.4 lb

## 2020-10-02 DIAGNOSIS — I2510 Atherosclerotic heart disease of native coronary artery without angina pectoris: Secondary | ICD-10-CM

## 2020-10-02 DIAGNOSIS — I2584 Coronary atherosclerosis due to calcified coronary lesion: Secondary | ICD-10-CM

## 2020-10-02 DIAGNOSIS — Z7189 Other specified counseling: Secondary | ICD-10-CM | POA: Diagnosis not present

## 2020-10-02 DIAGNOSIS — E785 Hyperlipidemia, unspecified: Secondary | ICD-10-CM

## 2020-10-02 DIAGNOSIS — I10 Essential (primary) hypertension: Secondary | ICD-10-CM | POA: Diagnosis not present

## 2020-10-02 NOTE — Progress Notes (Signed)
Cardiology Office Note:    Date:  10/02/2020   ID:  Sonia [name], DOB February 05, 1955, MRN [id number]  PCP:  Deland Pretty, MD  Cardiologist:  None  Electrophysiologist:  None   Referring MD: Deland Pretty, MD   Chief Complaint/Reason for Referral: Coronary artery calcifications  History of Present Illness:    Sonia [name] is a 65 y.o. female with a history of coronary artery calcifications noted on calcium scoring, hypertension, hyperlipidemia, GERD, diabetes mellitus.  Presents today for cardiovascular review in the setting of elevated coronary artery calcium score.  I have independently reviewed the images from her calcium scoring performed in May 2022.  Total coronary artery calcium score of 16 which places her in the 60th percentile for age and gender matched peers.  Was started on ASA 81 mg daily, unable to tolerate.  Simvastatin started after dx of diabetes. Uses insulin - rare palpitations after Novolog. Tolerating simvastatin well with good lipid control, LDL at goal.  The patient denies chest pain, chest pressure, dyspnea at rest or with exertion, PND, orthopnea, or leg swelling. Denies cough, fever, chills. Denies nausea, vomiting. Denies syncope or presyncope. Denies dizziness or lightheadedness. Denies snoring.   Past Medical History:  Diagnosis Date   Abnormal Pap smear 1990   Allergic rhinitis, cause unspecified    Allergy    SEASONAL   Anemia    Anxiety    DENIES   Asthma    Diabetes mellitus without complication ([place])    on insulin   Fibroid    GERD (gastroesophageal reflux disease)    Hypertension    DENIES   Polyp of nasal cavity    Skin cancer (melanoma) ([place])    STD (sexually transmitted disease)    HSV2   Unspecified asthma(493.90)     Past Surgical History:  Procedure Laterality Date   CRYOTHERAPY  1990   no HPV changes, no dysplasia, chronic cervicitis   LAPAROSCOPIC OVARIAN CYSTECTOMY     bilateral 2004   MELANOMA EXCISION WITH  SENTINEL LYMPH NODE BIOPSY     left leg   ROTATOR CUFF REPAIR Left 2006   SUPRACERVICAL ABDOMINAL HYSTERECTOMY  2002   cervix & ovaries retained   TONSILLECTOMY      Current Medications: Current Meds  Medication Sig   acyclovir (ZOVIRAX) 400 MG tablet Take 1 tablet (400 mg total) by mouth 2 (two) times daily. Increase to 2 tablets ('800mg'$ ) three times daily for 5 days with symptoms.   Biotin w/ Vitamins C & E (HAIR/SKIN/NAILS PO) Take 1 tablet by mouth daily.   esomeprazole (NEXIUM) 40 MG capsule Take 1 capsule (40 mg total) by mouth daily. *Please schedule follow up appointment*   ferrous sulfate 325 (65 FE) MG EC tablet Take 325 mg by mouth 3 (three) times a week.   fexofenadine (ALLEGRA) 180 MG tablet Take 180 mg by mouth daily.   fluticasone (FLONASE) 50 MCG/ACT nasal spray SPRAY TWO SPRAYS IN EACH NOSTRIL ONCE DAILY   hydrocortisone (ANUSOL-HC) 2.5 % rectal cream Place 1 application rectally 2 (two) times daily.   insulin aspart (NOVOLOG) 100 UNIT/ML FlexPen 3 (three) times a day as needed. Slide in scale   insulin glargine (LANTUS) 100 units/mL SOLN Inject into the skin daily. 20 units   levalbuterol (XOPENEX HFA) 45 MCG/ACT inhaler Inhale 2 puffs into the lungs every 6 (six) hours as needed for wheezing.   lisinopril (PRINIVIL,ZESTRIL) 5 MG tablet Take 5 mg by mouth daily.   metFORMIN (GLUCOPHAGE) 1000 MG  tablet Take 1,000 mg by mouth 2 (two) times daily.   montelukast (SINGULAIR) 10 MG tablet Take 1 tablet (10 mg total) by mouth daily.   Multiple Vitamin (MULTIVITAMIN) tablet Take 1 tablet by mouth daily.   naphazoline (NAPHCON) 0.1 % ophthalmic solution Place 1 drop into both eyes as needed for irritation.   naratriptan (AMERGE) 2.5 MG tablet Take 1 tablet (2.5 mg total) by mouth as needed. Take one (1) tablet at onset of headache; if returns or does not resolve, may repeat after 4 hours; do not exceed five (5) mg in 24 hours.   ONETOUCH VERIO test strip 1 each 3 (three) times  daily.   simvastatin (ZOCOR) 20 MG tablet Take 20 mg by mouth daily.   Tiotropium Bromide Monohydrate (SPIRIVA RESPIMAT) 2.5 MCG/ACT AERS Inhale 2 puffs daily (Patient taking differently: Inhale 2 puffs daily as needed)   Wheat Dextrin (BENEFIBER PO) Take by mouth. As needed     Allergies:   Ciprofloxacin, Doxycycline, Flagyl [metronidazole], Penicillins, and Amoxicillin   Social History   Tobacco Use   Smoking status: Never   Smokeless tobacco: Never  Vaping Use   Vaping Use: Never used  Substance Use Topics   Alcohol use: Yes    Comment: 1 a month   Drug use: No     Family History: The patient's family history includes Asthma in her sister; Cancer in her father, maternal grandmother, mother, and paternal grandfather; Heart attack in her maternal grandfather; Stroke in her paternal grandmother; Thyroid disease in her mother.  ROS:   Please see the history of present illness.    All other systems reviewed and are negative.  EKGs/Labs/Other Studies Reviewed:    The following studies were reviewed today:  EKG:  NSR, sinus arrhythmia, rate 74 bpm  Imaging studies that I have independently reviewed today: Coronary calcium scoring 06/19/2020.   Recent Labs: No results found for requested labs within last 8760 hours.  Recent Lipid Panel    Component Value Date/Time   CHOL  11/16/2007 0415    144        ATP III CLASSIFICATION:  <200     mg/dL   Desirable  200-239  mg/dL   Borderline High  >=240    mg/dL   High   TRIG 73 11/16/2007 0415   HDL 43 11/16/2007 0415   CHOLHDL 3.3 11/16/2007 0415   VLDL 15 11/16/2007 0415   LDLCALC  11/16/2007 0415    86        Total Cholesterol/HDL:CHD Risk Coronary Heart Disease Risk Table                     Men   Women  1/2 Average Risk   3.4   3.3    Physical Exam:    VS:  BP 110/70 (BP Location: Right Arm)   Pulse 74   Ht '5\' 3"'$  (1.6 m)   Wt 151 lb 6.4 oz (68.7 kg)   LMP 01/02/2001 Comment: supracervical hysterectomy  SpO2 99%    BMI 26.82 kg/m     Wt Readings from Last 5 Encounters:  10/02/20 151 lb 6.4 oz (68.7 kg)  07/17/20 151 lb 3.2 oz (68.6 kg)  07/16/20 151 lb (68.5 kg)  03/01/20 153 lb (69.4 kg)  12/13/19 153 lb 9.6 oz (69.7 kg)    Constitutional: No acute distress Eyes: sclera non-icteric, normal conjunctiva and lids ENMT: normal dentition, moist mucous membranes Cardiovascular: regular rhythm, normal rate, no murmurs. S1  and S2 normal. Radial pulses normal bilaterally. No jugular venous distention.  Respiratory: clear to auscultation bilaterally GI : normal bowel sounds, soft and nontender. No distention.   MSK: extremities warm, well perfused. No edema.  NEURO: grossly nonfocal exam, moves all extremities. PSYCH: alert and oriented x 3, normal mood and affect.   ASSESSMENT:    1. Coronary artery calcification   2. Hypertension, unspecified type   3. Hyperlipidemia, unspecified hyperlipidemia type   4. Cardiac risk counseling    PLAN:    Coronary artery calcification -She did not tolerate aspirin 81 mg daily due to GI symptoms.  Okay to hold at this time with low calcium score though ideally if she were able to resume for secondary prevention of CAD that would be optimal.  She will consider in the future. -Continue simvastatin, good lipid control.  We discussed intensification of statin therapy.  She is hesitant to try Crestor given complications her mother had on high intensity statin therapy.  Since she has good lipid control and remains asymptomatic from a cardiopulmonary standpoint with a modest coronary artery calcium score, okay to continue simvastatin at this time.  Hypertension, unspecified type - Plan: EKG 12-Lead -Blood pressure is well controlled today on lisinopril 5 mg daily, continue at current dose.  Hyperlipidemia, unspecified hyperlipidemia type -Continue simvastatin 20 mg daily.  Lipids are optimized.  Total time of encounter: 60 minutes total time of encounter, including  30 minutes spent in face-to-face patient care on the date of this encounter. This time includes coordination of care and counseling regarding above mentioned problem list. Remainder of non-face-to-face time involved reviewing chart documents/testing relevant to the patient encounter and documentation in the medical record. I have independently reviewed documentation from referring provider.  Greater than 38 pages of outside medical records reviewed in conjunction with this consultation.  Cherlynn Kaiser, MD, [place]   Shared Decision Making/Informed Consent:       Medication Adjustments/Labs and Tests Ordered: Current medicines are reviewed at length with the patient today.  Concerns regarding medicines are outlined above.   Orders Placed This Encounter  Procedures   EKG 12-Lead    No orders of the defined types were placed in this encounter.   Patient Instructions    Follow-Up: At [facility], you and your health needs are our priority.  As part of our continuing mission to provide you with exceptional heart care, we have created designated Provider Care Teams.  These Care Teams include your primary Cardiologist (physician) and Advanced Practice Providers (APPs -  Physician Assistants and Nurse Practitioners) who all work together to provide you with the care you need, when you need it.  We recommend signing up for the patient portal called "MyChart".  Sign up information is provided on this After Visit Summary.  MyChart is used to connect with patients for Virtual Visits (Telemedicine).  Patients are able to view lab/test results, encounter notes, upcoming appointments, etc.  Non-urgent messages can be sent to your provider as well.   To learn more about what you can do with MyChart, go to [email or web address].    Your next appointment:   12 month(s)  The format for your next appointment:   In Person  Provider:   Cherlynn Kaiser, MD

## 2020-10-02 NOTE — Patient Instructions (Signed)
   Follow-Up: At [facility], you and your health needs are our priority.  As part of our continuing mission to provide you with exceptional heart care, we have created designated Provider Care Teams.  These Care Teams include your primary Cardiologist (physician) and Advanced Practice Providers (APPs -  Physician Assistants and Nurse Practitioners) who all work together to provide you with the care you need, when you need it.  We recommend signing up for the patient portal called "MyChart".  Sign up information is provided on this After Visit Summary.  MyChart is used to connect with patients for Virtual Visits (Telemedicine).  Patients are able to view lab/test results, encounter notes, upcoming appointments, etc.  Non-urgent messages can be sent to your provider as well.   To learn more about what you can do with MyChart, go to [email or web address].    Your next appointment:   12 month(s)  The format for your next appointment:   In Person  Provider:   Cherlynn Kaiser, MD

## 2020-10-21 DIAGNOSIS — Z23 Encounter for immunization: Secondary | ICD-10-CM | POA: Diagnosis not present

## 2020-11-08 ENCOUNTER — Other Ambulatory Visit (HOSPITAL_BASED_OUTPATIENT_CLINIC_OR_DEPARTMENT_OTHER): Payer: Self-pay

## 2020-11-29 ENCOUNTER — Other Ambulatory Visit: Payer: Self-pay

## 2020-11-29 ENCOUNTER — Other Ambulatory Visit (HOSPITAL_BASED_OUTPATIENT_CLINIC_OR_DEPARTMENT_OTHER): Payer: Self-pay

## 2020-11-29 ENCOUNTER — Ambulatory Visit: Payer: HMO | Attending: Internal Medicine

## 2020-11-29 DIAGNOSIS — Z23 Encounter for immunization: Secondary | ICD-10-CM

## 2020-11-29 MED ORDER — PFIZER COVID-19 VAC BIVALENT 30 MCG/0.3ML IM SUSP
INTRAMUSCULAR | 0 refills | Status: DC
  Filled 2020-11-29: qty 0.3, 1d supply, fill #0

## 2020-11-29 NOTE — Progress Notes (Signed)
   Covid-19 Vaccination Clinic  Name:  Sonia [name]    MRN: [id number] DOB: 1956-01-27  11/29/2020  [name] was observed post Covid-19 immunization for 15 minutes without incident. She was provided with Vaccine Information Sheet and instruction to access the V-Safe system.   [name] was instructed to call 911 with any severe reactions post vaccine: Difficulty breathing  Swelling of face and throat  A fast heartbeat  A bad rash all over body  Dizziness and weakness   Immunizations Administered     Name Date Dose VIS Date Route   Pfizer Covid-19 Vaccine Bivalent Booster 11/29/2020 10:23 AM 0.3 mL 10/02/2020 Intramuscular   Manufacturer: [place]   Lot: [id number]   [place]: [phone]

## 2020-12-03 ENCOUNTER — Telehealth: Payer: Self-pay | Admitting: Internal Medicine

## 2020-12-03 MED ORDER — SPIRIVA RESPIMAT 2.5 MCG/ACT IN AERS: INHALATION_SPRAY | RESPIRATORY_TRACT | 6 refills | Status: DC

## 2020-12-03 MED ORDER — LEVALBUTEROL TARTRATE 45 MCG/ACT IN AERO: 2.0000 | INHALATION_SPRAY | Freq: Four times a day (QID) | RESPIRATORY_TRACT | 6 refills | Status: DC | PRN

## 2020-12-03 NOTE — Telephone Encounter (Signed)
Call made to patient, confirmed DOB. Confirmed pharmacy and medication. Refill sent.   Nothing further needed at this time.  

## 2020-12-06 DIAGNOSIS — E7800 Pure hypercholesterolemia, unspecified: Secondary | ICD-10-CM | POA: Diagnosis not present

## 2020-12-06 DIAGNOSIS — I10 Essential (primary) hypertension: Secondary | ICD-10-CM | POA: Diagnosis not present

## 2020-12-06 DIAGNOSIS — D509 Iron deficiency anemia, unspecified: Secondary | ICD-10-CM | POA: Diagnosis not present

## 2020-12-06 DIAGNOSIS — E559 Vitamin D deficiency, unspecified: Secondary | ICD-10-CM | POA: Diagnosis not present

## 2020-12-06 DIAGNOSIS — Z0000 Encounter for general adult medical examination without abnormal findings: Secondary | ICD-10-CM | POA: Diagnosis not present

## 2020-12-06 DIAGNOSIS — E1165 Type 2 diabetes mellitus with hyperglycemia: Secondary | ICD-10-CM | POA: Diagnosis not present

## 2020-12-08 ENCOUNTER — Encounter: Payer: Self-pay | Admitting: Internal Medicine

## 2020-12-08 NOTE — Assessment & Plan Note (Signed)
No evident recurrence. Discussed potential for relapse.

## 2020-12-08 NOTE — Assessment & Plan Note (Signed)
Remains mild and uncomplicated Plan- continue meds, Singulair refilled

## 2020-12-11 DIAGNOSIS — D2272 Melanocytic nevi of left lower limb, including hip: Secondary | ICD-10-CM | POA: Diagnosis not present

## 2020-12-11 DIAGNOSIS — D1801 Hemangioma of skin and subcutaneous tissue: Secondary | ICD-10-CM | POA: Diagnosis not present

## 2020-12-11 DIAGNOSIS — L821 Other seborrheic keratosis: Secondary | ICD-10-CM | POA: Diagnosis not present

## 2020-12-11 DIAGNOSIS — D2271 Melanocytic nevi of right lower limb, including hip: Secondary | ICD-10-CM | POA: Diagnosis not present

## 2020-12-11 DIAGNOSIS — D225 Melanocytic nevi of trunk: Secondary | ICD-10-CM | POA: Diagnosis not present

## 2020-12-11 DIAGNOSIS — L814 Other melanin hyperpigmentation: Secondary | ICD-10-CM | POA: Diagnosis not present

## 2020-12-11 DIAGNOSIS — Z85820 Personal history of malignant melanoma of skin: Secondary | ICD-10-CM | POA: Diagnosis not present

## 2020-12-11 DIAGNOSIS — L659 Nonscarring hair loss, unspecified: Secondary | ICD-10-CM | POA: Diagnosis not present

## 2020-12-11 DIAGNOSIS — D2262 Melanocytic nevi of left upper limb, including shoulder: Secondary | ICD-10-CM | POA: Diagnosis not present

## 2021-02-26 DIAGNOSIS — L720 Epidermal cyst: Secondary | ICD-10-CM | POA: Diagnosis not present

## 2021-02-26 DIAGNOSIS — L2089 Other atopic dermatitis: Secondary | ICD-10-CM | POA: Diagnosis not present

## 2021-03-07 ENCOUNTER — Encounter: Payer: Self-pay | Admitting: Physician Assistant

## 2021-03-07 ENCOUNTER — Other Ambulatory Visit: Payer: Self-pay

## 2021-03-07 ENCOUNTER — Ambulatory Visit: Payer: HMO | Admitting: Physician Assistant

## 2021-03-07 VITALS — BP 118/76 | HR 80 | Wt 143.0 lb

## 2021-03-07 DIAGNOSIS — G43009 Migraine without aura, not intractable, without status migrainosus: Secondary | ICD-10-CM | POA: Diagnosis not present

## 2021-03-07 MED ORDER — NARATRIPTAN HCL 2.5 MG PO TABS: 2.5000 mg | ORAL_TABLET | ORAL | 6 refills | Status: DC | PRN

## 2021-03-07 NOTE — Progress Notes (Signed)
History:  Sonia [name] is a 66 y.o. G0P0000 who presents to clinic today for yearly headache followup.  She notes the rainy weather is hard but overall her headaches are well controlled.    HIT6:50 Number of days in the last 4 weeks with:  Severe headache: 0 Moderate headache: 3 Mild headache: 2  No headache: 23   Past Medical History:  Diagnosis Date   Abnormal Pap smear 1990   Allergic rhinitis, cause unspecified    Allergy    SEASONAL   Anemia    Anxiety    DENIES   Asthma    Diabetes mellitus without complication ([place])    on insulin   Fibroid    GERD (gastroesophageal reflux disease)    Hypertension    DENIES   Polyp of nasal cavity    Skin cancer (melanoma) ([place])    STD (sexually transmitted disease)    HSV2   Unspecified asthma(493.90)     Social History   Socioeconomic History   Marital status: Married    Spouse name: Not on file   Number of children: Not on file   Years of education: Not on file   Highest education level: Not on file  Occupational History    Employer: [employer]  Tobacco Use   Smoking status: Never   Smokeless tobacco: Never  Vaping Use   Vaping Use: Never used  Substance and Sexual Activity   Alcohol use: Yes    Comment: 1 a month   Drug use: No   Sexual activity: Yes    Partners: Male    Birth control/protection: Surgical    Comment: supracervical hysterectomy  Other Topics Concern   Not on file  Social History Narrative   Not on file   Social Determinants of Health   Financial Resource Strain: Not on file  Food Insecurity: Not on file  Transportation Needs: Not on file  Physical Activity: Not on file  Stress: Not on file  Social Connections: Not on file  Intimate Partner Violence: Not on file    Family History  Problem Relation Age of Onset   Asthma Sister        SEVERE CHRONIC   Cancer Mother        bile duct liver cancer   Thyroid disease Mother    Cancer Father        leukemia   Cancer  Maternal Grandmother        stomach that started around rectum   Heart attack Maternal Grandfather    Stroke Paternal Grandmother    Cancer Paternal Grandfather        started in kidneys    Allergies  Allergen Reactions   Ciprofloxacin    Doxycycline     Nausea vomiting   Flagyl [Metronidazole]     GI intolerance   Penicillins    Amoxicillin Swelling and Rash    Current Outpatient Medications on File Prior to Visit  Medication Sig Dispense Refill   acyclovir (ZOVIRAX) 400 MG tablet Take 1 tablet (400 mg total) by mouth 2 (two) times daily. Increase to 2 tablets (800mg ) three times daily for 5 days with symptoms. 180 tablet 3   Biotin w/ Vitamins C & E (HAIR/SKIN/NAILS PO) Take 1 tablet by mouth daily.     esomeprazole (NEXIUM) 40 MG capsule Take 1 capsule (40 mg total) by mouth daily. *Please schedule follow up appointment* 90 capsule 0   ferrous sulfate 325 (65 FE) MG EC tablet Take  325 mg by mouth 3 (three) times a week.     fexofenadine (ALLEGRA) 180 MG tablet Take 180 mg by mouth daily.     fluticasone (FLONASE) 50 MCG/ACT nasal spray SPRAY TWO SPRAYS IN EACH NOSTRIL ONCE DAILY 48 mL 3   hydrocortisone (ANUSOL-HC) 2.5 % rectal cream Place 1 application rectally 2 (two) times daily. 30 g 1   insulin aspart (NOVOLOG) 100 UNIT/ML FlexPen 3 (three) times a day as needed. Slide in scale     insulin glargine (LANTUS) 100 units/mL SOLN Inject into the skin daily. 20 units     lisinopril (PRINIVIL,ZESTRIL) 5 MG tablet Take 5 mg by mouth daily.  2   metFORMIN (GLUCOPHAGE) 1000 MG tablet Take 1,000 mg by mouth 2 (two) times daily.     montelukast (SINGULAIR) 10 MG tablet Take 1 tablet (10 mg total) by mouth daily. 90 tablet 3   Multiple Vitamin (MULTIVITAMIN) tablet Take 1 tablet by mouth daily.     naratriptan (AMERGE) 2.5 MG tablet Take 1 tablet (2.5 mg total) by mouth as needed. Take one (1) tablet at onset of headache; if returns or does not resolve, may repeat after 4 hours; do not  exceed five (5) mg in 24 hours. 18 tablet 6   ONETOUCH VERIO test strip 1 each 3 (three) times daily.     simvastatin (ZOCOR) 20 MG tablet Take 20 mg by mouth daily.     Tiotropium Bromide Monohydrate (SPIRIVA RESPIMAT) 2.5 MCG/ACT AERS Inhale 2 puffs daily 4 g 6   Wheat Dextrin (BENEFIBER PO) Take by mouth. As needed     levalbuterol (XOPENEX HFA) 45 MCG/ACT inhaler Inhale 2 puffs into the lungs every 6 (six) hours as needed for wheezing. 15 g 6   naphazoline (NAPHCON) 0.1 % ophthalmic solution Place 1 drop into both eyes as needed for irritation. (Patient not taking: Reported on 03/07/2021)     No current facility-administered medications on file prior to visit.     Review of Systems:  All pertinent positive/negative included in HPI, all other review of systems are negative   Objective:  Physical Exam BP 118/76    Pulse 80    Wt 143 lb (64.9 kg)    LMP 01/02/2001 Comment: supracervical hysterectomy   BMI 25.33 kg/m  CONSTITUTIONAL: Well-developed, well-nourished female in no acute distress.  EYES: EOM intact ENT: Normocephalic CARDIOVASCULAR: Regular rate   RESPIRATORY: Normal rate.   MUSCULOSKELETAL: Normal ROM SKIN: Warm, dry without erythema  NEUROLOGICAL: Alert, oriented, CN II-XII grossly intact, Appropriate balance PSYCH: Normal behavior, mood   Assessment & Plan:  Assessment: 1. Migraine without aura and without status migrainosus, not intractable      Plan: Continue current regimen: amerge for acute therapy.  She limits herself to 2/day and 2 days per week maximums.   She sees cardiology and no concerns for continuing triptan.   Follow-up in 12 months or sooner PRN  Paticia Stack, PA-C 03/07/2021 9:11 AM

## 2021-03-07 NOTE — Patient Instructions (Signed)
Migraine Headache A migraine headache is an intense, throbbing pain on one side or both sides of the head. Migraine headaches may also cause other symptoms, such as nausea, vomiting, and sensitivity to light and noise. A migraine headache can last from 4 hours to 3 days. Talk with your doctor about what things may bring on (trigger) your migraine headaches. What are the causes? The exact cause of this condition is not known. However, a migraine may be caused when nerves in the brain become irritated and release chemicals that cause inflammation of blood vessels. This inflammation causes pain. This condition may be triggered or caused by: Drinking alcohol. Smoking. Taking medicines, such as: Medicine used to treat chest pain (nitroglycerin). Birth control pills. Estrogen. Certain blood pressure medicines. Eating or drinking products that contain nitrates, glutamate, aspartame, or tyramine. Aged cheeses, chocolate, or caffeine may also be triggers. Doing physical activity. Other things that may trigger a migraine headache include: Menstruation. Pregnancy. Hunger. Stress. Lack of sleep or too much sleep. Weather changes. Fatigue. What increases the risk? The following factors may make you more likely to experience migraine headaches: Being a certain age. This condition is more common in people who are 25-55 years old. Being female. Having a family history of migraine headaches. Being Caucasian. Having a mental health condition, such as depression or anxiety. Being obese. What are the signs or symptoms? The main symptom of this condition is pulsating or throbbing pain. This pain may: Happen in any area of the head, such as on one side or both sides. Interfere with daily activities. Get worse with physical activity. Get worse with exposure to bright lights or loud noises. Other symptoms may include: Nausea. Vomiting. Dizziness. General sensitivity to bright lights, loud noises, or  smells. Before you get a migraine headache, you may get warning signs (an aura). An aura may include: Seeing flashing lights or having blind spots. Seeing bright spots, halos, or zigzag lines. Having tunnel vision or blurred vision. Having numbness or a tingling feeling. Having trouble talking. Having muscle weakness. Some people have symptoms after a migraine headache (postdromal phase), such as: Feeling tired. Difficulty concentrating. How is this diagnosed? A migraine headache can be diagnosed based on: Your symptoms. A physical exam. Tests, such as: CT scan or an MRI of the head. These imaging tests can help rule out other causes of headaches. Taking fluid from the spine (lumbar puncture) and analyzing it (cerebrospinal fluid analysis, or CSF analysis). How is this treated? This condition may be treated with medicines that: Relieve pain. Relieve nausea. Prevent migraine headaches. Treatment for this condition may also include: Acupuncture. Lifestyle changes like avoiding foods that trigger migraine headaches. Biofeedback. Cognitive behavioral therapy. Follow these instructions at home: Medicines Take over-the-counter and prescription medicines only as told by your health care provider. Ask your health care provider if the medicine prescribed to you: Requires you to avoid driving or using heavy machinery. Can cause constipation. You may need to take these actions to prevent or treat constipation: Drink enough fluid to keep your urine pale yellow. Take over-the-counter or prescription medicines. Eat foods that are high in fiber, such as beans, whole grains, and fresh fruits and vegetables. Limit foods that are high in fat and processed sugars, such as fried or sweet foods. Lifestyle Do not drink alcohol. Do not use any products that contain nicotine or tobacco, such as cigarettes, e-cigarettes, and chewing tobacco. If you need help quitting, ask your health care  provider. Get at least 8   hours of sleep every night. Find ways to manage stress, such as meditation, deep breathing, or yoga. General instructions   Keep a journal to find out what may trigger your migraine headaches. For example, write down: What you eat and drink. How much sleep you get. Any change to your diet or medicines. If you have a migraine headache: Avoid things that make your symptoms worse, such as bright lights. It may help to lie down in a dark, quiet room. Do not drive or use heavy machinery. Ask your health care provider what activities are safe for you while you are experiencing symptoms. Keep all follow-up visits as told by your health care provider. This is important. Contact a health care provider if: You develop symptoms that are different or more severe than your usual migraine headache symptoms. You have more than 15 headache days in one month. Get help right away if: Your migraine headache becomes severe. Your migraine headache lasts longer than 72 hours. You have a fever. You have a stiff neck. You have vision loss. Your muscles feel weak or like you cannot control them. You start to lose your balance often. You have trouble walking. You faint. You have a seizure. Summary A migraine headache is an intense, throbbing pain on one side or both sides of the head. Migraines may also cause other symptoms, such as nausea, vomiting, and sensitivity to light and noise. This condition may be treated with medicines and lifestyle changes. You may also need to avoid certain things that trigger a migraine headache. Keep a journal to find out what may trigger your migraine headaches. Contact your health care provider if you have more than 15 headache days in a month or you develop symptoms that are different or more severe than your usual migraine headache symptoms. This information is not intended to replace advice given to you by your health care provider. Make sure you  discuss any questions you have with your health care provider. Document Revised: 05/13/2018 Document Reviewed: 03/03/2018 Elsevier Patient Education  2022 [employer]  

## 2021-03-26 DIAGNOSIS — E1165 Type 2 diabetes mellitus with hyperglycemia: Secondary | ICD-10-CM | POA: Diagnosis not present

## 2021-03-26 DIAGNOSIS — I10 Essential (primary) hypertension: Secondary | ICD-10-CM | POA: Diagnosis not present

## 2021-03-26 DIAGNOSIS — E7800 Pure hypercholesterolemia, unspecified: Secondary | ICD-10-CM | POA: Diagnosis not present

## 2021-06-03 ENCOUNTER — Encounter (HOSPITAL_BASED_OUTPATIENT_CLINIC_OR_DEPARTMENT_OTHER): Payer: Self-pay | Admitting: Obstetrics & Gynecology

## 2021-06-03 ENCOUNTER — Ambulatory Visit (INDEPENDENT_AMBULATORY_CARE_PROVIDER_SITE_OTHER): Payer: HMO | Admitting: Obstetrics & Gynecology

## 2021-06-03 ENCOUNTER — Other Ambulatory Visit (HOSPITAL_COMMUNITY)
Admission: RE | Admit: 2021-06-03 | Discharge: 2021-06-03 | Disposition: A | Discharge status: Home / Self Care | Payer: HMO | Source: Ambulatory Visit | Attending: Obstetrics & Gynecology | Admitting: Obstetrics & Gynecology

## 2021-06-03 VITALS — BP 113/65 | HR 61 | Ht 63.0 in | Wt 147.6 lb

## 2021-06-03 DIAGNOSIS — R1032 Left lower quadrant pain: Secondary | ICD-10-CM | POA: Diagnosis not present

## 2021-06-03 DIAGNOSIS — Z124 Encounter for screening for malignant neoplasm of cervix: Secondary | ICD-10-CM

## 2021-06-03 DIAGNOSIS — Z90711 Acquired absence of uterus with remaining cervical stump: Secondary | ICD-10-CM | POA: Diagnosis not present

## 2021-06-03 DIAGNOSIS — D369 Benign neoplasm, unspecified site: Secondary | ICD-10-CM | POA: Diagnosis not present

## 2021-06-03 NOTE — Progress Notes (Signed)
GYNECOLOGY  VISIT ? ?CC:   LLQ pain ? ?HPI: ?66 y.o. G0P0000 Married White or Caucasian female here for h/o LLQ pain that was present about two weeks ago that lasted for two nights.  She reports she at a lot of broccoli that she had with take out.  She reports overnight after eating the broccoli, she had a pulsating type of pain.  After several bowel movements, she reports the pain resolved.  She is still having a heaviness kind of sensation.  Denies having diarrhea.  Denies nausea or emesis.  Denies vaginal bleeding or discharge.   ? ?H/o colonoscopy with adenomatous polyp.  Last colonoscopy 02/2016.  Follow up 5 years recommended.  Has not been contacted about this yet.   ? ?H/o diabetes.  Last HbA1c was 7.1 in the chart.  Pt reports it was actually more recently 8.0.  She is has a [employer] 3 now that is helping her monitor her blood sugars.   ? ? ?Patient Active Problem List  ? Diagnosis Date Noted  ? HSV (herpes simplex virus) anogenital infection 07/18/2020  ? History of cryosurgery 07/18/2020  ? Migraine without aura 04/07/2012  ? AODM 04/27/2008  ? NASAL POLYP 04/27/2007  ? Seasonal and perennial allergic rhinitis 04/27/2007  ? Allergic asthma, mild intermittent, uncomplicated 70/78/6754  ? ? ?Past Medical History:  ?Diagnosis Date  ? Abnormal Pap smear 1990  ? Allergic rhinitis, cause unspecified   ? Allergy   ? SEASONAL  ? Anemia   ? Anxiety   ? DENIES  ? Asthma   ? Diabetes mellitus without complication ([place])   ? on insulin  ? Fibroid   ? GERD (gastroesophageal reflux disease)   ? Hypertension   ? DENIES  ? Polyp of nasal cavity   ? Skin cancer (melanoma) ([place])   ? STD (sexually transmitted disease)   ? HSV2  ? Unspecified asthma(493.90)   ? ? ?Past Surgical History:  ?Procedure Laterality Date  ? CRYOTHERAPY  1990  ? no HPV changes, no dysplasia, chronic cervicitis  ? LAPAROSCOPIC OVARIAN CYSTECTOMY    ? bilateral 2004  ? MELANOMA EXCISION WITH SENTINEL LYMPH NODE BIOPSY    ? left leg  ? ROTATOR  CUFF REPAIR Left 2006  ? SUPRACERVICAL ABDOMINAL HYSTERECTOMY  2002  ? cervix & ovaries retained  ? TONSILLECTOMY    ? ? ?MEDS:   ?Current Outpatient Medications on File Prior to Visit  ?Medication Sig Dispense Refill  ? acyclovir (ZOVIRAX) 400 MG tablet Take 1 tablet (400 mg total) by mouth 2 (two) times daily. Increase to 2 tablets ('800mg'$ ) three times daily for 5 days with symptoms. 180 tablet 3  ? Biotin w/ Vitamins C & E (HAIR/SKIN/NAILS PO) Take 1 tablet by mouth daily.    ? esomeprazole (NEXIUM) 40 MG capsule Take 1 capsule (40 mg total) by mouth daily. *Please schedule follow up appointment* 90 capsule 0  ? ferrous sulfate 325 (65 FE) MG EC tablet Take 325 mg by mouth 3 (three) times a week.    ? fexofenadine (ALLEGRA) 180 MG tablet Take 180 mg by mouth daily.    ? fluticasone (FLONASE) 50 MCG/ACT nasal spray SPRAY TWO SPRAYS IN EACH NOSTRIL ONCE DAILY 48 mL 3  ? hydrocortisone (ANUSOL-HC) 2.5 % rectal cream Place 1 application rectally 2 (two) times daily. 30 g 1  ? insulin aspart (NOVOLOG) 100 UNIT/ML FlexPen 3 (three) times a day as needed. Slide in scale    ? insulin glargine (LANTUS)  100 units/mL SOLN Inject into the skin daily. 20 units    ? lisinopril (PRINIVIL,ZESTRIL) 5 MG tablet Take 5 mg by mouth daily.  2  ? metFORMIN (GLUCOPHAGE) 1000 MG tablet Take 1,000 mg by mouth 2 (two) times daily.    ? montelukast (SINGULAIR) 10 MG tablet Take 1 tablet (10 mg total) by mouth daily. 90 tablet 3  ? Multiple Vitamin (MULTIVITAMIN) tablet Take 1 tablet by mouth daily.    ? naratriptan (AMERGE) 2.5 MG tablet Take 1 tablet (2.5 mg total) by mouth as needed. Take one (1) tablet at onset of headache; if returns or does not resolve, may repeat after 4 hours; do not exceed five (5) mg in 24 hours. 18 tablet 6  ? ONETOUCH VERIO test strip 1 each 3 (three) times daily.    ? simvastatin (ZOCOR) 20 MG tablet Take 20 mg by mouth daily.    ? Tiotropium Bromide Monohydrate (SPIRIVA RESPIMAT) 2.5 MCG/ACT AERS Inhale 2 puffs  daily 4 g 6  ? Wheat Dextrin (BENEFIBER PO) Take by mouth. As needed    ? levalbuterol (XOPENEX HFA) 45 MCG/ACT inhaler Inhale 2 puffs into the lungs every 6 (six) hours as needed for wheezing. 15 g 6  ? ?No current facility-administered medications on file prior to visit.  ? ? ?ALLERGIES: Ciprofloxacin, Doxycycline, Flagyl [metronidazole], Penicillins, and Amoxicillin ? ?Family History  ?Problem Relation Age of Onset  ? Asthma Sister   ?     SEVERE CHRONIC  ? Cancer Mother   ?     bile duct liver cancer  ? Thyroid disease Mother   ? Cancer Father   ?     leukemia  ? Cancer Maternal Grandmother   ?     stomach that started around rectum  ? Heart attack Maternal Grandfather   ? Stroke Paternal Grandmother   ? Cancer Paternal Grandfather   ?     started in kidneys  ? ? ?SH:  married, non smoker ? ?Review of Systems  ?Gastrointestinal:   ?     LLQ fullness  ? ?PHYSICAL EXAMINATION:   ? ?BP 113/65 (BP Location: Right Arm, Patient Position: Sitting, Cuff Size: Normal)   Pulse 61   Ht '5\' 3"'$  (1.6 m) Comment: reported  Wt 147 lb 9.6 oz (67 kg)   LMP 01/02/2001 Comment: supracervical hysterectomy  BMI 26.15 kg/m?     ?General appearance: alert, cooperative and appears stated age ?Abdomen: soft, non-tender; bowel sounds normal; no masses,  no organomegaly ?Lymph:  no inguinal LAD noted ? ?Pelvic: External genitalia:  no lesions ?             Urethra:  normal appearing urethra with no masses, tenderness or lesions ?             Bartholins and Skenes: normal    ?             Vagina: normal appearing vagina with normal color and discharge, no lesions ?             Cervix: no lesions, Pap smear obtained ?             Bimanual Exam:  Uterus:  normal size, contour, position, consistency, mobility, non-tender ?             Adnexa: no mass, fullness, tenderness ? ?Chaperone, Octaviano Batty, CMA, was present for exam. ? ?Assessment/Plan: ?1. LLQ pain ?- exam normal.  Will order ultrasound to ensure ovaries are normal ?-  US PELVIS  TRANSVAGINAL NON-OB (TV ONLY); Future ? ?2. Cervical cancer screening ?- with pain and pap >3 years ago, obtained today ?- Cytology - PAP( [facility]) ? ?3. Tubular adenoma ?- pt is going to call GI as colonoscopy appears to have been due 02/2021.   ? ? ? ?

## 2021-06-05 DIAGNOSIS — D509 Iron deficiency anemia, unspecified: Secondary | ICD-10-CM | POA: Diagnosis not present

## 2021-06-05 DIAGNOSIS — E1165 Type 2 diabetes mellitus with hyperglycemia: Secondary | ICD-10-CM | POA: Diagnosis not present

## 2021-06-05 DIAGNOSIS — E559 Vitamin D deficiency, unspecified: Secondary | ICD-10-CM | POA: Diagnosis not present

## 2021-06-06 LAB — CYTOLOGY - PAP: Diagnosis: NEGATIVE

## 2021-06-06 NOTE — Addendum Note (Signed)
Addended by: Blenda Nicely on: 06/06/2021 09:57 AM ? ? Modules accepted: Orders ? ?

## 2021-06-07 ENCOUNTER — Ambulatory Visit (HOSPITAL_BASED_OUTPATIENT_CLINIC_OR_DEPARTMENT_OTHER)
Admission: RE | Admit: 2021-06-07 | Discharge: 2021-06-07 | Disposition: A | Discharge status: Home / Self Care | Payer: HMO | Source: Ambulatory Visit | Attending: Obstetrics & Gynecology | Admitting: Obstetrics & Gynecology

## 2021-06-07 DIAGNOSIS — R1032 Left lower quadrant pain: Secondary | ICD-10-CM | POA: Insufficient documentation

## 2021-06-11 DIAGNOSIS — D2272 Melanocytic nevi of left lower limb, including hip: Secondary | ICD-10-CM | POA: Diagnosis not present

## 2021-06-11 DIAGNOSIS — D485 Neoplasm of uncertain behavior of skin: Secondary | ICD-10-CM | POA: Diagnosis not present

## 2021-06-11 DIAGNOSIS — L814 Other melanin hyperpigmentation: Secondary | ICD-10-CM | POA: Diagnosis not present

## 2021-06-11 DIAGNOSIS — D2261 Melanocytic nevi of right upper limb, including shoulder: Secondary | ICD-10-CM | POA: Diagnosis not present

## 2021-06-11 DIAGNOSIS — D2271 Melanocytic nevi of right lower limb, including hip: Secondary | ICD-10-CM | POA: Diagnosis not present

## 2021-06-11 DIAGNOSIS — L821 Other seborrheic keratosis: Secondary | ICD-10-CM | POA: Diagnosis not present

## 2021-06-11 DIAGNOSIS — L661 Lichen planopilaris: Secondary | ICD-10-CM | POA: Diagnosis not present

## 2021-06-11 DIAGNOSIS — D224 Melanocytic nevi of scalp and neck: Secondary | ICD-10-CM | POA: Diagnosis not present

## 2021-06-11 DIAGNOSIS — Z85820 Personal history of malignant melanoma of skin: Secondary | ICD-10-CM | POA: Diagnosis not present

## 2021-06-11 DIAGNOSIS — D2262 Melanocytic nevi of left upper limb, including shoulder: Secondary | ICD-10-CM | POA: Diagnosis not present

## 2021-06-11 DIAGNOSIS — D1801 Hemangioma of skin and subcutaneous tissue: Secondary | ICD-10-CM | POA: Diagnosis not present

## 2021-06-11 DIAGNOSIS — D225 Melanocytic nevi of trunk: Secondary | ICD-10-CM | POA: Diagnosis not present

## 2021-06-11 HISTORY — PX: MOLE REMOVAL: SHX2046

## 2021-06-12 DIAGNOSIS — Z794 Long term (current) use of insulin: Secondary | ICD-10-CM | POA: Diagnosis not present

## 2021-06-12 DIAGNOSIS — E1165 Type 2 diabetes mellitus with hyperglycemia: Secondary | ICD-10-CM | POA: Diagnosis not present

## 2021-06-12 DIAGNOSIS — I10 Essential (primary) hypertension: Secondary | ICD-10-CM | POA: Diagnosis not present

## 2021-06-12 DIAGNOSIS — E7800 Pure hypercholesterolemia, unspecified: Secondary | ICD-10-CM | POA: Diagnosis not present

## 2021-06-12 DIAGNOSIS — K219 Gastro-esophageal reflux disease without esophagitis: Secondary | ICD-10-CM | POA: Diagnosis not present

## 2021-06-12 DIAGNOSIS — D509 Iron deficiency anemia, unspecified: Secondary | ICD-10-CM | POA: Diagnosis not present

## 2021-06-16 NOTE — Progress Notes (Signed)
? ? ?06/17/2021 ?Sonia [name] ?[id number] ?02/06/1955 ? ?Referring provider: Deland Pretty, MD ?Primary GI doctor: Dr. Silverio Decamp ? ?ASSESSMENT AND PLAN:  ? ?Screen for colon cancer ?02/28/2016 colonoscopy repeat exam due to inadequate bowel prep normal other than small internal hemorrhoids.   ?Recall 5 years (02/2021) ?Will need 2 day prep ?We have discussed the risks of bleeding, infection, perforation, medication reactions, and remote risk of death associated with colonoscopy. All questions were answered and the patient acknowledges these risk and wishes to proceed. ? ?Left lower quadrant abdominal pain ?Likely secondary to gas ?Given information ?If worsening can consider CT AB and pelvis ? ?Normocytic anemia ?-     Folate; Future ?-     Ferritin; Future ?On iron once a week, low HGB with normal MCV ?Iron 69, will get ferritin to evaluate for IDA, if low will plan with EGD.  ?Will check folate/B12 ?H/H 11.4/36.5, MCV 89, WBC 9.6 ? ?B12 deficiency ?-     Vitamin B12; Future ? ? ?History of Present Illness:  ?66 y.o. female  with a past medical history of chronic GERD, diabetes, IDA and others listed below, returns to clinic today for evaluation of left lower quadrant abdominal pain. ? ?12/13/2019 office visit with Dr. Silverio Decamp for hemorrhoids ?02/28/2016 colonoscopy repeat exam due to inadequate bowel prep normal other than small internal hemorrhoids.  Recall 5 years (02/2021) ?02/2016 endoscopy LA grade a esophagitis and gastritis ? ?Left lower quadrant abdominal pain after eating broccoli at [place], ordered extra broccoli and that evening she had sharp intermittent LLQ pain through the night.  ?She took benefiber and fiber tablets and had a large BM for several days.  ?She felt better but still had a "heaviness LLQ".  ?This has had completely resolved.    ?Normal transvaginal ultrasound with GYN other than overlying bowel gas ?Patient denies GERD.  ?Patient denies dysphagia, nausea, vomiting,  melena.  ?Patient has a BM every day, 1-2 x a day well formed, brown.  ?Patient denies change in bowel habits, constipation, diarrhea, hematochezia.  ?Patient reports family history of colon cancer or other gastrointestinal malignancies, maternal grandmother with rectal cancer, mother with bile duct/liver cancer.  ? ?Labs reviewed 06/05/2021 with PCP- no ferritin/b12/folate ?ALT 21, AST 12, TBILI 0.4, ?A1C 6.6 ?H/H 11.4/36.5, MCV 89, WBC 9.6 ?Iron 69- she is on iron once a week.  ? ?Current Medications:  ? ?Current Outpatient Medications (Endocrine & Metabolic):  ?  insulin aspart (NOVOLOG) 100 UNIT/ML FlexPen, 3 (three) times a day as needed. Slide in scale ?  insulin glargine (LANTUS) 100 units/mL SOLN, Inject into the skin daily. 20 units ?  metFORMIN (GLUCOPHAGE) 1000 MG tablet, Take 1,000 mg by mouth 2 (two) times daily. ? ?Current Outpatient Medications (Cardiovascular):  ?  lisinopril (PRINIVIL,ZESTRIL) 5 MG tablet, Take 5 mg by mouth daily. ?  simvastatin (ZOCOR) 20 MG tablet, Take 20 mg by mouth daily. ? ?Current Outpatient Medications (Respiratory):  ?  fexofenadine (ALLEGRA) 180 MG tablet, Take 180 mg by mouth daily. ?  fluticasone (FLONASE) 50 MCG/ACT nasal spray, SPRAY TWO SPRAYS IN EACH NOSTRIL ONCE DAILY ?  montelukast (SINGULAIR) 10 MG tablet, Take 1 tablet (10 mg total) by mouth daily. ?  Tiotropium Bromide Monohydrate (SPIRIVA RESPIMAT) 2.5 MCG/ACT AERS, Inhale 2 puffs daily ?  levalbuterol (XOPENEX HFA) 45 MCG/ACT inhaler, Inhale 2 puffs into the lungs every 6 (six) hours as needed for wheezing. ? ?Current Outpatient Medications (Analgesics):  ?  naratriptan (AMERGE) 2.5 MG tablet,  Take 1 tablet (2.5 mg total) by mouth as needed. Take one (1) tablet at onset of headache; if returns or does not resolve, may repeat after 4 hours; do not exceed five (5) mg in 24 hours. ? ?Current Outpatient Medications (Hematological):  ?  ferrous sulfate 325 (65 FE) MG EC tablet, Take 325 mg by mouth once a  week. ? ?Current Outpatient Medications (Other):  ?  acyclovir (ZOVIRAX) 400 MG tablet, Take 1 tablet (400 mg total) by mouth 2 (two) times daily. Increase to 2 tablets ('800mg'$ ) three times daily for 5 days with symptoms. ?  augmented betamethasone dipropionate (DIPROLENE-AF) 4.48 % cream, 1 application. 2 (two) times daily as needed. ?  b complex vitamins capsule, Take 1 capsule by mouth daily. ?  Biotin w/ Vitamins C & E (HAIR/SKIN/NAILS PO), Take 2 tablets by mouth daily. 2 gummies a day ?  Continuous Blood Gluc Receiver (FREESTYLE LIBRE READER) DEVI, by Does not apply route. In Left Arm ?  esomeprazole (NEXIUM) 40 MG capsule, Take 1 capsule (40 mg total) by mouth daily. *Please schedule follow up appointment* ?  hydrocortisone (ANUSOL-HC) 2.5 % rectal cream, Place 1 application rectally 2 (two) times daily. ?  Multiple Vitamin (MULTIVITAMIN) tablet, Take 2 tablets by mouth daily. 2 gummies a day ?  ONETOUCH VERIO test strip, 1 each 3 (three) times daily. ?  Wheat Dextrin (BENEFIBER PO), Take by mouth. As needed ? ?Surgical History:  ?She  has a past surgical history that includes Tonsillectomy; Rotator cuff repair (Left, 2006); Cryotherapy (1990); Laparoscopic ovarian cystectomy; Supracervical abdominal hysterectomy (2002); Melanoma excision with sentinel lymph node dissection; and Mole removal (06/11/2021). ?Family History:  ?Her family history includes Asthma in her sister; Cancer in her father, maternal grandmother, mother, and paternal grandfather; Heart attack in her maternal grandfather; Stroke in her paternal grandmother; Thyroid disease in her mother. ?Social History:  ? reports that she has quit smoking. Her smoking use included cigarettes. She has never used smokeless tobacco. She reports current alcohol use. She reports that she does not use drugs. ? ?Current Medications, Allergies, Past Medical History, Past Surgical History, Family History and Social History were reviewed in [facility] record. ? ?Physical Exam: ?BP 110/66   Pulse 84   Ht '5\' 3"'$  (1.6 m)   Wt 149 lb 6 oz (67.8 kg)   LMP 01/02/2001 Comment: supracervical hysterectomy  BMI 26.46 kg/m?  ?General:   Pleasant, well developed female in no acute distress ?Heart : Regular rate and rhythm; no murmurs ?Pulm: Clear anteriorly; no wheezing ?Abdomen:  Soft, Flat AB, Active bowel sounds. No tenderness . Without guarding and Without rebound, No organomegaly appreciated. ?Rectal: Not evaluated ?Extremities:  without  edema. ?Neurologic:  Alert and  oriented x4;  No focal deficits.  ?Psych:  Cooperative. Normal mood and affect. ? ? ?Vladimir Crofts, PA-C ?06/17/21 ?

## 2021-06-17 ENCOUNTER — Other Ambulatory Visit (INDEPENDENT_AMBULATORY_CARE_PROVIDER_SITE_OTHER): Payer: HMO

## 2021-06-17 ENCOUNTER — Encounter: Payer: Self-pay | Admitting: Physician Assistant

## 2021-06-17 ENCOUNTER — Ambulatory Visit: Payer: HMO | Admitting: Physician Assistant

## 2021-06-17 VITALS — BP 110/66 | HR 84 | Ht 63.0 in | Wt 149.4 lb

## 2021-06-17 DIAGNOSIS — E538 Deficiency of other specified B group vitamins: Secondary | ICD-10-CM | POA: Diagnosis not present

## 2021-06-17 DIAGNOSIS — R1032 Left lower quadrant pain: Secondary | ICD-10-CM

## 2021-06-17 DIAGNOSIS — Z1211 Encounter for screening for malignant neoplasm of colon: Secondary | ICD-10-CM

## 2021-06-17 DIAGNOSIS — D649 Anemia, unspecified: Secondary | ICD-10-CM

## 2021-06-17 LAB — FERRITIN: Ferritin: 8.5 ng/mL — ABNORMAL LOW (ref 10.0–291.0)

## 2021-06-17 LAB — VITAMIN B12: Vitamin B-12: 797 pg/mL (ref 211–911)

## 2021-06-17 LAB — FOLATE: Folate: >23.5 ng/mL (ref >5.9)

## 2021-06-17 NOTE — Patient Instructions (Addendum)
?Abdominal bloating and discomfort may be due to intestinal sensitivity or symptoms of irritable bowel syndrome. To relieve symptoms, avoid:  ?Broccoli  ?Baked beans  ?Cabbage  ?Carbonated drinks  ?Cauliflower  ?Chewing gum  ?Hard candy ?Abdominal distention resulting from weak abdominal muscles:  ?Is better in the morning  ?Gets worse as the day progresses  ?Is relieved by lying down ?Flatulence is gas created through bacterial action in the bowel and passed rectally. Keep in mind that:  ?10-18 passages per day are normal  ?Primary gases are harmless and odorless  ?Noticeable smells are trace gases related to food intake ?Foods to AVOID that are likely to form gas include:  ?Milk, dairy products, and medications that contain lactose--If your body doesn't produce the enzyme (lactase) to break it down.  ?Certain vegetables--baked beans, cauliflower, broccoli, cabbage  ?Certain starches--wheat, oats, corn, potatoes. Rice is a good substitute. ?Identify offending foods. Reduce or eliminate these gas-forming foods from your diet. Can look at the [place].  ?  ? ?FODMAP stands for fermentable oligo-, di-, mono-saccharides and polyols (1). ?These are the scientific terms used to classify groups of carbs that are notorious for triggering digestive symptoms like bloating, gas and stomach pain.  ? ?FODMAPs are found in a wide range of foods in varying amounts. Some foods contain just one type, while others contain several. ? ?The main dietary sources of the four groups of FODMAPs include:  ?Oligosaccharides: Wheat, rye, legumes and various fruits and vegetables, such as garlic and onions. ? ?Disaccharides: Milk, yogurt and soft cheese. Lactose is the main carb. ? ?Monosaccharides: Various fruit including figs and mangoes, and sweeteners such as honey and agave nectar. Fructose is the main carb. ? ?Polyols: Certain fruits and vegetables including blackberries and lychee, as well as some low-calorie sweeteners like those  in sugar-free gum. ?  ?Keep a food diary. This will help you identify foods that cause symptoms. Write down: ?What you eat and when. ?What symptoms you have. ?When symptoms occur in relation to your meals. ?Avoid foods that cause symptoms. Talk with your dietitian about other ways to get the same nutrients that are in these foods. ?Eat your meals slowly, in a relaxed setting. ?Aim to eat 5-6 small meals per day. Do not skip meals. ?Drink enough fluids to keep your urine clear or pale yellow. ?If dairy products cause your symptoms to flare up, try eating less of them. You might be able to handle yogurt better than other dairy products because it contains bacteria that help with digestion. ?  ? ? ? ?You have been scheduled for an endoscopy and colonoscopy. Please follow the written instructions given to you at your visit today. ?Please pick up your prep supplies at the pharmacy within the next 1-3 days. ?If you use inhalers (even only as needed), please bring them with you on the day of your procedure. ? ?Your provider has requested that you go to the basement level for lab work before leaving today. Press "B" on the elevator. The lab is located at the first door on the left as you exit the elevator. ? ?Due to recent changes in healthcare laws, you may see the results of your imaging and laboratory studies on MyChart before your provider has had a chance to review them.  We understand that in some cases there may be results that are confusing or concerning to you. Not all laboratory results come back in the same time frame and the provider may be waiting  for multiple results in order to interpret others.  Please give [place] 48 hours in order for your provider to thoroughly review all the results before contacting the office for clarification of your results.  ? ? ? ?I appreciate the opportunity to care for you. ?Vicie Mutters, PA-C ?

## 2021-06-23 DIAGNOSIS — E1165 Type 2 diabetes mellitus with hyperglycemia: Secondary | ICD-10-CM | POA: Diagnosis not present

## 2021-06-23 DIAGNOSIS — E7800 Pure hypercholesterolemia, unspecified: Secondary | ICD-10-CM | POA: Diagnosis not present

## 2021-06-23 DIAGNOSIS — I10 Essential (primary) hypertension: Secondary | ICD-10-CM | POA: Diagnosis not present

## 2021-06-26 NOTE — Progress Notes (Signed)
Reviewed and agree with documentation and assessment and plan. K. Veena Bryar Dahms , MD   

## 2021-07-07 DIAGNOSIS — H2513 Age-related nuclear cataract, bilateral: Secondary | ICD-10-CM | POA: Diagnosis not present

## 2021-07-07 DIAGNOSIS — E119 Type 2 diabetes mellitus without complications: Secondary | ICD-10-CM | POA: Diagnosis not present

## 2021-07-07 DIAGNOSIS — H5021 Vertical strabismus, right eye: Secondary | ICD-10-CM | POA: Diagnosis not present

## 2021-07-16 NOTE — Progress Notes (Signed)
Patient ID: Sonia [name], female    DOB: 06/24/1955, 66 y.o.   MRN: [id number]  HPI  female never smoker followed for asthma, allergic rhinitis/nasal polyps, complicated by DM 2, GERD, HBP Office spirometry- 06/29/17-  WNL -----------------------------------------------------------------------------------------------------   07/17/20- 64 yoF never smoker followed for Asthma, Allergic Rhinitis/Nasal Polyps, complicated by DM 2, GERD, HBP, CAD,  -Spiriva handihaler, Xopenex hfa, Singulair , Allegra,     Intolerant LABA stimulation -----no cough/ wheezing/ SOB  Covid vax- 2 Phizer ------Doing good now, was occass. Wheezing earlier in Spring Meds reviewed. Breathing ok with no exacerbation. Discussed cardiac CT. CT cardiac calcium scoring 06/19/20- lung looked ok.  CAD noted.   07/17/21- 65 yoF never smoker followed for Asthma, Allergic Rhinitis/Nasal Polyps, complicated by DM 2, GERD, HBP, CAD,  -Spiriva handihaler, Xopenex hfa, Singulair , Allegra,     Intolerant LABA stimulation Covid vax- 5 Phizer ACT score 24 Has done very well.  No significant exacerbation.  1 or 2 episodes where she incidentally noted minimal wheeze this spring.  Also some increased nasal stuffiness during peak pollen season.  She felt her current meds managed fine.  Asked refill Singulair.  ROS-see HPI    + = positive Constitutional:   No-   weight loss, night sweats, fevers, chills, fatigue, lassitude. HEENT:   No-  headaches, difficulty swallowing, tooth/dental problems, sore throat,      No-sneezing, itching, ear ache,+ nasal congestion, +post nasal drip,  CV:  No-   chest pain, orthopnea, PND, swelling in lower extremities, anasarca, dizziness, palpitations Resp: No-   shortness of breath with exertion or at rest.              No-   productive cough,  No non-productive cough,  No- coughing up of blood.              No-   change in color of mucus.  No- wheezing.   Skin: No-   rash or lesions. GI:  No-    heartburn, indigestion, abdominal pain, nausea, vomiting,  GU:  MS:  No-   joint pain or swelling.   Neuro-     nothing unusual Psych:  No- change in mood or affect. No depression or anxiety.  No memory loss.   OBJ- Physical Exam General- Alert, Oriented, Affect-appropriate, Distress- none acute Skin- rash-none, lesions- none, excoriation- none Lymphadenopathy- none Head- atraumatic            Eyes- Gross vision intact, PERRLA, conjunctivae and secretions clear            Ears- Hearing, canals-normal            Nose-  + sniffing, no-Septal dev, mucus, polyps, erosion, perforation             Throat- Mallampati III , mucosa clear , drainage- none, tonsils- atrophic Neck- flexible , trachea midline, no stridor , thyroid nl, carotid no bruit Chest - symmetrical excursion , unlabored           Heart/CV- RRR , no murmur , no gallop  , no rub, nl s1 s2                           - JVD- none , edema- none, stasis changes- none, varices- none           Lung- clear to P&A, wheeze- none, cough- none , dullness-none, rub- none  Chest wall-  Abd-  Br/ Gen/ Rectal- Not done, not indicated Extrem- cyanosis- none, clubbing, none, atrophy- none, strength- nl Neuro- grossly intact to observation

## 2021-07-17 ENCOUNTER — Ambulatory Visit: Payer: HMO | Admitting: Internal Medicine

## 2021-07-17 ENCOUNTER — Encounter: Payer: Self-pay | Admitting: Internal Medicine

## 2021-07-17 DIAGNOSIS — J302 Other seasonal allergic rhinitis: Secondary | ICD-10-CM | POA: Diagnosis not present

## 2021-07-17 DIAGNOSIS — J3089 Other allergic rhinitis: Secondary | ICD-10-CM

## 2021-07-17 DIAGNOSIS — J4520 Mild intermittent asthma, uncomplicated: Secondary | ICD-10-CM

## 2021-07-17 MED ORDER — MONTELUKAST SODIUM 10 MG PO TABS: 10.0000 mg | ORAL_TABLET | Freq: Every day | ORAL | 3 refills | Status: DC

## 2021-07-17 NOTE — Patient Instructions (Signed)
Singulair refilled   Glad you are doing well  Please let us know if we can help

## 2021-07-17 NOTE — Assessment & Plan Note (Signed)
Adequately controlled with current meds.  Medications reviewed. Plan-continue current therapies with refills when needed

## 2021-07-17 NOTE — Assessment & Plan Note (Signed)
Some increase in nasal stuffiness and drainage during peak pollen season earlier this spring.  Adequately controlled. Plan-continue Allegra, Singulair, adding Flonase when needed

## 2021-07-22 ENCOUNTER — Encounter: Payer: Self-pay | Admitting: Podiatry

## 2021-07-22 ENCOUNTER — Ambulatory Visit (INDEPENDENT_AMBULATORY_CARE_PROVIDER_SITE_OTHER): Payer: HMO | Admitting: Podiatry

## 2021-07-22 DIAGNOSIS — E119 Type 2 diabetes mellitus without complications: Secondary | ICD-10-CM | POA: Diagnosis not present

## 2021-07-22 NOTE — Progress Notes (Signed)
Subjective: Sonia [name] is a 66 y.o. female patient with history of diabetes who presents to office today for a diabetic foot exam. Relates overall doing well is getting some dryness in feet and wondering about lotions.   Patient denies any other pedal complaints at this time.  A1c not recorded.  Patient Active Problem List   Diagnosis Date Noted   S/P laparoscopic supracervical hysterectomy 06/03/2021   HSV (herpes simplex virus) anogenital infection 07/18/2020   History of cryosurgery 07/18/2020   Migraine without aura 04/07/2012   AODM 04/27/2008   NASAL POLYP 04/27/2007   Seasonal and perennial allergic rhinitis 04/27/2007   Allergic asthma, mild intermittent, uncomplicated 95/62/1308   Current Outpatient Medications on File Prior to Visit  Medication Sig Dispense Refill   acyclovir (ZOVIRAX) 400 MG tablet Take 1 tablet (400 mg total) by mouth 2 (two) times daily. Increase to 2 tablets ('800mg'$ ) three times daily for 5 days with symptoms. 180 tablet 3   augmented betamethasone dipropionate (DIPROLENE-AF) 6.57 % cream 1 application. 2 (two) times daily as needed.     b complex vitamins capsule Take 1 capsule by mouth daily.     Biotin w/ Vitamins C & E (HAIR/SKIN/NAILS PO) Take 2 tablets by mouth daily. 2 gummies a day     Continuous Blood Gluc Receiver (FREESTYLE LIBRE READER) DEVI by Does not apply route. In Left Arm     esomeprazole (NEXIUM) 40 MG capsule Take 1 capsule (40 mg total) by mouth daily. *Please schedule follow up appointment* 90 capsule 0   ferrous sulfate 325 (65 FE) MG EC tablet Take 325 mg by mouth once a week.     fexofenadine (ALLEGRA) 180 MG tablet Take 180 mg by mouth daily.     fluticasone (FLONASE) 50 MCG/ACT nasal spray SPRAY TWO SPRAYS IN EACH NOSTRIL ONCE DAILY 48 mL 3   hydrocortisone (ANUSOL-HC) 2.5 % rectal cream Place 1 application rectally 2 (two) times daily. 30 g 1   insulin aspart (NOVOLOG) 100 UNIT/ML FlexPen 3 (three) times a day as needed.  Slide in scale     insulin glargine (LANTUS) 100 units/mL SOLN Inject into the skin daily. 20 units     levalbuterol (XOPENEX HFA) 45 MCG/ACT inhaler Inhale 2 puffs into the lungs every 6 (six) hours as needed for wheezing. 15 g 6   lisinopril (PRINIVIL,ZESTRIL) 5 MG tablet Take 5 mg by mouth daily.  2   metFORMIN (GLUCOPHAGE) 1000 MG tablet Take 1,000 mg by mouth 2 (two) times daily.     montelukast (SINGULAIR) 10 MG tablet Take 1 tablet (10 mg total) by mouth daily. 90 tablet 3   Multiple Vitamin (MULTIVITAMIN) tablet Take 2 tablets by mouth daily. 2 gummies a day     naratriptan (AMERGE) 2.5 MG tablet Take 1 tablet (2.5 mg total) by mouth as needed. Take one (1) tablet at onset of headache; if returns or does not resolve, may repeat after 4 hours; do not exceed five (5) mg in 24 hours. 18 tablet 6   ONETOUCH VERIO test strip 1 each 3 (three) times daily.     simvastatin (ZOCOR) 20 MG tablet Take 20 mg by mouth daily.     Tiotropium Bromide Monohydrate (SPIRIVA RESPIMAT) 2.5 MCG/ACT AERS Inhale 2 puffs daily 4 g 6   Wheat Dextrin (BENEFIBER PO) Take by mouth. As needed     No current facility-administered medications on file prior to visit.   Allergies  Allergen Reactions   Ciprofloxacin  Doxycycline     Nausea vomiting   Flagyl [Metronidazole]     GI intolerance   Penicillins    Amoxicillin Swelling and Rash    Recent Results (from the past 2160 hour(s))  Cytology - PAP( [place])     Status: None   Collection Time: 06/03/21  1:58 PM  Result Value Ref Range   Adequacy      Satisfactory for evaluation. The presence or absence of an   Adequacy      endocervical/transformation zone component cannot be determined because   Adequacy of atrophy.    Diagnosis      - Negative for intraepithelial lesion or malignancy (NILM)   Comment Inflammation and atrophic changes are present.   Ferritin     Status: Abnormal   Collection Time: 06/17/21 10:07 AM  Result Value Ref Range    Ferritin 8.5 (L) 10.0 - 291.0 ng/mL  Folate     Status: None   Collection Time: 06/17/21 10:07 AM  Result Value Ref Range   Folate >23.5 >5.9 ng/mL  Vitamin B12     Status: None   Collection Time: 06/17/21 10:07 AM  Result Value Ref Range   Vitamin B-12 797 211 - 911 pg/mL    Objective: General: Patient is awake, alert, and oriented x 3 and in no acute distress.  Integument: Skin is warm, dry and supple bilateral. Nails are polished and well manicured. No open lesions or preulcerative lesions present bilateral. Remaining integument unremarkable.  Vasculature:  Dorsalis Pedis pulse 2/4 bilateral. Posterior Tibial pulse  2/4 bilateral.  Capillary fill time <3 sec 1-5 bilateral. Positive hair growth to the level of the digits. Temperature gradient within normal limits. No varicosities present bilateral. No edema present bilateral.   Neurology: The patient has intact sensation measured with a 5.07/10g Semmes Weinstein Monofilament at all pedal sites bilateral . Vibratory sensation intact bilateral with tuning fork. No Babinski sign present bilateral.   Musculoskeletal: No symptomatic pedal deformities noted bilateral. Muscular strength 5/5 in all lower extremity muscular groups bilateral without pain on range of motion . No tenderness with calf compression bilateral.  Assessment and Plan: Problem List Items Addressed This Visit   None Visit Diagnoses     Diabetes mellitus without complication ([place])    -  Primary   Encounter for diabetic foot exam ([place])            -Examined patient. -Discussed and educated patient on diabetic foot care, especially with  regards to the vascular, neurological and musculoskeletal systems.  -Stressed the importance of good glycemic control and the detriment of not  controlling glucose levels in relation to the foot. -All patient's nails are well manicured -Patient is a low risk diabetic and should return annually for foot exam advised patient if she  has difficulty with her pedicures providing nail care then she may routinely turn to our office for nail care however at this time since patient has a good pedicurist there is no acute needs except for an exam annually unless symptoms change or worsen -Answered all patient questions -Patient to return yearly for diabetic foot exam -Patient advised to call the office if any problems or questions arise in the meantime.  Lorenda Peck, DPM

## 2021-07-24 ENCOUNTER — Ambulatory Visit: Payer: HMO | Admitting: Sports Medicine

## 2021-07-28 ENCOUNTER — Ambulatory Visit (INDEPENDENT_AMBULATORY_CARE_PROVIDER_SITE_OTHER): Payer: HMO | Admitting: Obstetrics & Gynecology

## 2021-07-28 ENCOUNTER — Encounter (HOSPITAL_BASED_OUTPATIENT_CLINIC_OR_DEPARTMENT_OTHER): Payer: Self-pay | Admitting: Obstetrics & Gynecology

## 2021-07-28 VITALS — BP 118/73 | HR 79 | Ht 63.0 in | Wt 145.8 lb

## 2021-07-28 DIAGNOSIS — R1032 Left lower quadrant pain: Secondary | ICD-10-CM | POA: Diagnosis not present

## 2021-07-28 DIAGNOSIS — A609 Anogenital herpesviral infection, unspecified: Secondary | ICD-10-CM | POA: Diagnosis not present

## 2021-07-28 DIAGNOSIS — Z1231 Encounter for screening mammogram for malignant neoplasm of breast: Secondary | ICD-10-CM

## 2021-07-28 MED ORDER — ACYCLOVIR 400 MG PO TABS: 400.0000 mg | ORAL_TABLET | Freq: Two times a day (BID) | ORAL | 3 refills | Status: DC

## 2021-07-28 NOTE — Progress Notes (Signed)
66 y.o. [place] Married White or Caucasian female here for follow up.  She was seen in May due to LLQ pain.  Complete pelvic exam and Pap smear was done that day.  She has hx of supracervical hysterectomy.  She thinks the LLQ issues were due to broccoli but she did see Vicie Mutters at GI.  She does have a colonoscopy and endoscopy scheduled.  Pain has resolved.  Ferritin was low and this is why is the endoscopy is scheduled.  Desires having breast exam completed today.  Last MMG 07/25/2020.  MMG ordered for pt.  H/O HSV and does need acyclovir refill.  Takes suppressive therapy and has done well for many years with this.  Denies vaginal bleeding.   Health Maintenance: PCP:  Dr. Shelia Media.  Screening blood work done there.   Vaccines are up to date:  yes Colonoscopy:  02/28/2016 MMG:  07/25/2020 Negative BMD:  07/25/2020 Osteopenia Last pap smear:  06/03/2021.   H/o abnormal pap smear:      reports that she has quit smoking. Her smoking use included cigarettes. She has never used smokeless tobacco. She reports current alcohol use. She reports that she does not use drugs.  Past Medical History:  Diagnosis Date   Abnormal Pap smear 1990   Allergic rhinitis, cause unspecified    Allergy    SEASONAL   Anemia    Anxiety    DENIES   Asthma    Diabetes mellitus without complication ([place])    on insulin   Fibroid    GERD (gastroesophageal reflux disease)    Hypertension    DENIES   Polyp of nasal cavity    Skin cancer (melanoma) ([place])    STD (sexually transmitted disease)    HSV2   Unspecified asthma(493.90)     Past Surgical History:  Procedure Laterality Date   CRYOTHERAPY  1990   no HPV changes, no dysplasia, chronic cervicitis   LAPAROSCOPIC OVARIAN CYSTECTOMY     bilateral 2004   MELANOMA EXCISION WITH SENTINEL LYMPH NODE BIOPSY     left leg   MOLE REMOVAL  06/11/2021   was abnomal on right leg and was removed by dermatologist   ROTATOR CUFF REPAIR Left 2006    SUPRACERVICAL ABDOMINAL HYSTERECTOMY  2002   cervix & ovaries retained   TONSILLECTOMY      Current Outpatient Medications  Medication Sig Dispense Refill   augmented betamethasone dipropionate (DIPROLENE-AF) 7.10 % cream 1 application. 2 (two) times daily as needed.     b complex vitamins capsule Take 1 capsule by mouth daily.     Biotin w/ Vitamins C & E (HAIR/SKIN/NAILS PO) Take 2 tablets by mouth daily. 2 gummies a day     Continuous Blood Gluc Receiver (FREESTYLE LIBRE READER) DEVI by Does not apply route. In Left Arm     esomeprazole (NEXIUM) 40 MG capsule Take 1 capsule (40 mg total) by mouth daily. *Please schedule follow up appointment* 90 capsule 0   ferrous sulfate 325 (65 FE) MG EC tablet Take 325 mg by mouth once a week.     fexofenadine (ALLEGRA) 180 MG tablet Take 180 mg by mouth daily.     fluticasone (FLONASE) 50 MCG/ACT nasal spray SPRAY TWO SPRAYS IN EACH NOSTRIL ONCE DAILY 48 mL 3   hydrocortisone (ANUSOL-HC) 2.5 % rectal cream Place 1 application rectally 2 (two) times daily. 30 g 1   insulin aspart (NOVOLOG) 100 UNIT/ML FlexPen 3 (three) times a day as needed. Slide in  scale     insulin glargine (LANTUS) 100 units/mL SOLN Inject into the skin daily. 20 units     lisinopril (PRINIVIL,ZESTRIL) 5 MG tablet Take 5 mg by mouth daily.  2   metFORMIN (GLUCOPHAGE) 1000 MG tablet Take 1,000 mg by mouth 2 (two) times daily.     montelukast (SINGULAIR) 10 MG tablet Take 1 tablet (10 mg total) by mouth daily. 90 tablet 3   Multiple Vitamin (MULTIVITAMIN) tablet Take 2 tablets by mouth daily. 2 gummies a day     naratriptan (AMERGE) 2.5 MG tablet Take 1 tablet (2.5 mg total) by mouth as needed. Take one (1) tablet at onset of headache; if returns or does not resolve, may repeat after 4 hours; do not exceed five (5) mg in 24 hours. 18 tablet 6   ONETOUCH VERIO test strip 1 each 3 (three) times daily.     simvastatin (ZOCOR) 20 MG tablet Take 20 mg by mouth daily.     Tiotropium  Bromide Monohydrate (SPIRIVA RESPIMAT) 2.5 MCG/ACT AERS Inhale 2 puffs daily 4 g 6   Wheat Dextrin (BENEFIBER PO) Take by mouth. As needed     acyclovir (ZOVIRAX) 400 MG tablet Take 1 tablet (400 mg total) by mouth 2 (two) times daily. Increase to 2 tablets ('800mg'$ ) three times daily for 5 days with symptoms. 180 tablet 3   levalbuterol (XOPENEX HFA) 45 MCG/ACT inhaler Inhale 2 puffs into the lungs every 6 (six) hours as needed for wheezing. 15 g 6   No current facility-administered medications for this visit.    Family History  Problem Relation Age of Onset   Cancer Mother        bile duct liver cancer   Thyroid disease Mother        thryoid removed   Cancer Father        leukemia   Asthma Sister        SEVERE CHRONIC   Cancer Maternal Grandmother        stomach that started around rectum   Heart attack Maternal Grandfather    Stroke Paternal Grandmother    Cancer Paternal Grandfather        started in kidneys   Esophageal cancer Neg Hx     Review of Systems  Constitutional: Negative.   Genitourinary: Negative.     Exam:   BP 118/73 (BP Location: Right Arm, Patient Position: Sitting, Cuff Size: Large)   Pulse 79   Ht '5\' 3"'$  (1.6 m) Comment: Reported  Wt 145 lb 12.8 oz (66.1 kg)   LMP 01/02/2001 Comment: supracervical hysterectomy  BMI 25.83 kg/m   Height: '5\' 3"'$  (160 cm) (Reported)  General appearance: alert, cooperative and appears stated age Breasts: normal appearance, no masses or tenderness Abdomen: soft, non-tender; bowel sounds normal; no masses,  no organomegaly Lymph nodes: Cervical, supraclavicular, and axillary nodes normal.  No abnormal inguinal nodes palpated Neurologic: Grossly normal  Chaperone, Octaviano Batty, CMA, was present for exam.  Assessment/Plan: 1. HSV (herpes simplex virus) anogenital infection - stable with medication - acyclovir (ZOVIRAX) 400 MG tablet; Take 1 tablet (400 mg total) by mouth 2 (two) times daily. Increase to 2 tablets ('800mg'$ )  three times daily for 5 days with symptoms.  Dispense: 180 tablet; Refill: 3 - plan gyn wellness exam 1 year  2. Encounter for screening mammogram for malignant neoplasm of breast - MM 3D SCREEN BREAST BILATERAL; Future  3.  LLQ pain - has colonoscopy and endoscopy planned (as also had low  ferritin)

## 2021-08-15 ENCOUNTER — Telehealth: Payer: Self-pay | Admitting: Physician Assistant

## 2021-08-15 ENCOUNTER — Ambulatory Visit (HOSPITAL_BASED_OUTPATIENT_CLINIC_OR_DEPARTMENT_OTHER)
Admission: RE | Admit: 2021-08-15 | Discharge: 2021-08-15 | Disposition: A | Discharge status: Home / Self Care | Payer: HMO | Source: Ambulatory Visit | Attending: Obstetrics & Gynecology | Admitting: Obstetrics & Gynecology

## 2021-08-15 DIAGNOSIS — Z1231 Encounter for screening mammogram for malignant neoplasm of breast: Secondary | ICD-10-CM | POA: Diagnosis not present

## 2021-08-15 NOTE — Telephone Encounter (Signed)
Patient has an upcoming colon/egd.  She wears a sugar monitoring device and is asking if she can leave it on or does she need to take it off for the procedure.  Please call and advise.  Thank you.

## 2021-08-15 NOTE — Telephone Encounter (Signed)
Spoke with patient & let her know that she can keep free style libre glucose monitoring device in for procedure. Pt verbalized all understanding.

## 2021-08-20 ENCOUNTER — Telehealth: Payer: Self-pay | Admitting: Physician Assistant

## 2021-08-20 NOTE — Telephone Encounter (Signed)
Patient called has a procedure scheduled for tomorrow and she a few questions and concerns regarding her prep. Please advise

## 2021-08-20 NOTE — Telephone Encounter (Signed)
Patient had questions about her prep and if it was ok that she ae fresh peaches a few days ago. I explained that she would be fine after completing her two day prep and that her stool should be a clear yellow when she is on the way coming here. She understood and had no further questions.

## 2021-08-21 ENCOUNTER — Encounter: Payer: Self-pay | Admitting: Gastroenterology

## 2021-08-21 ENCOUNTER — Telehealth: Payer: Self-pay | Admitting: Gastroenterology

## 2021-08-21 ENCOUNTER — Ambulatory Visit (AMBULATORY_SURGERY_CENTER): Payer: PPO | Admitting: Gastroenterology

## 2021-08-21 VITALS — BP 91/41 | HR 71 | Temp 98.0°F | Resp 12 | Ht 63.0 in | Wt 145.0 lb

## 2021-08-21 DIAGNOSIS — R1032 Left lower quadrant pain: Secondary | ICD-10-CM | POA: Diagnosis not present

## 2021-08-21 DIAGNOSIS — Z1211 Encounter for screening for malignant neoplasm of colon: Secondary | ICD-10-CM

## 2021-08-21 DIAGNOSIS — D120 Benign neoplasm of cecum: Secondary | ICD-10-CM

## 2021-08-21 DIAGNOSIS — K317 Polyp of stomach and duodenum: Secondary | ICD-10-CM

## 2021-08-21 DIAGNOSIS — K5730 Diverticulosis of large intestine without perforation or abscess without bleeding: Secondary | ICD-10-CM

## 2021-08-21 DIAGNOSIS — D500 Iron deficiency anemia secondary to blood loss (chronic): Secondary | ICD-10-CM

## 2021-08-21 DIAGNOSIS — E119 Type 2 diabetes mellitus without complications: Secondary | ICD-10-CM | POA: Diagnosis not present

## 2021-08-21 DIAGNOSIS — K2970 Gastritis, unspecified, without bleeding: Secondary | ICD-10-CM

## 2021-08-21 DIAGNOSIS — D123 Benign neoplasm of transverse colon: Secondary | ICD-10-CM

## 2021-08-21 DIAGNOSIS — I10 Essential (primary) hypertension: Secondary | ICD-10-CM | POA: Diagnosis not present

## 2021-08-21 DIAGNOSIS — K649 Unspecified hemorrhoids: Secondary | ICD-10-CM

## 2021-08-21 DIAGNOSIS — D122 Benign neoplasm of ascending colon: Secondary | ICD-10-CM | POA: Diagnosis not present

## 2021-08-21 MED ORDER — SUCRALFATE 1 GM/10ML PO SUSP: 1.0000 g | Freq: Four times a day (QID) | ORAL | 0 refills | Status: DC

## 2021-08-21 MED ORDER — ESOMEPRAZOLE MAGNESIUM 40 MG PO PACK: PACK | ORAL | 2 refills | Status: DC

## 2021-08-21 MED ORDER — SODIUM CHLORIDE 0.9 % IV SOLN: 500.0000 mL | Freq: Once | INTRAVENOUS | Status: DC

## 2021-08-21 NOTE — Telephone Encounter (Signed)
Patient had procedures done with Dr. Silverio Decamp today.  She prescribed Nexium packets and Carafate for patient.  Insurance will not pay for the packets and the cost is over $1,200.  The carafate bottle costs $70.  Patient is asking if there is a pill form that may be less expensive and for the Nexium she wanted to know if there was a substitute packet that was less expensive or if you wanted her to double up on the 40 mg capsules she is already taking.  Please call patient and advise as well as the pharmacy to adjust/change these prescriptions.  Thank you.

## 2021-08-21 NOTE — Progress Notes (Signed)
Called to room to assist during endoscopic procedure.  Patient ID and intended procedure confirmed with present staff. Received instructions for my participation in the procedure from the performing physician.  

## 2021-08-21 NOTE — Progress Notes (Signed)
[facility] History and Physical   Primary Care Physician:  Deland Pretty, MD   Reason for Procedure:  Iron deficiency anemia  Plan:    EGD and colonoscopy with possible interventions as needed     HPI: Sonia [name] is a very pleasant 66 y.o. female here for EGD and colonoscopy for iron deficiency anemia. Denies any nausea, vomiting, abdominal pain, melena or bright red blood per rectum  The risks and benefits as well as alternatives of endoscopic procedure(s) have been discussed and reviewed. All questions answered. The patient agrees to proceed.    Past Medical History:  Diagnosis Date   Abnormal Pap smear 1990   Allergic rhinitis, cause unspecified    Allergy    SEASONAL   Anemia    Anxiety    DENIES   Asthma    Diabetes mellitus without complication ([place])    on insulin   Fibroid    GERD (gastroesophageal reflux disease)    Hyperlipidemia    Hypertension    DENIES   Polyp of nasal cavity    Skin cancer (melanoma) ([place])    STD (sexually transmitted disease)    HSV2   Unspecified asthma(493.90)     Past Surgical History:  Procedure Laterality Date   COLONOSCOPY     CRYOTHERAPY  1990   no HPV changes, no dysplasia, chronic cervicitis   LAPAROSCOPIC OVARIAN CYSTECTOMY     bilateral 2004   MELANOMA EXCISION WITH SENTINEL LYMPH NODE BIOPSY     left leg   MOLE REMOVAL  06/11/2021   was abnomal on right leg and was removed by dermatologist   ROTATOR CUFF REPAIR Left 2006   SUPRACERVICAL ABDOMINAL HYSTERECTOMY  2002   cervix & ovaries retained   TONSILLECTOMY     UPPER GASTROINTESTINAL ENDOSCOPY      Prior to Admission medications   Medication Sig Start Date End Date Taking? Authorizing Provider  acyclovir (ZOVIRAX) 400 MG tablet Take 1 tablet (400 mg total) by mouth 2 (two) times daily. Increase to 2 tablets ('800mg'$ ) three times daily for 5 days with symptoms. 07/28/21  Yes Megan Salon, MD  b complex vitamins capsule Take 1 capsule by  mouth daily.   Yes [provider]  Biotin w/ Vitamins C & E (HAIR/SKIN/NAILS PO) Take 2 tablets by mouth daily. 2 gummies a day   Yes [provider]  Continuous Blood Gluc Receiver (FREESTYLE LIBRE READER) DEVI by Does not apply route. In Left Arm   Yes [provider]  esomeprazole (NEXIUM) 40 MG capsule Take 1 capsule (40 mg total) by mouth daily. *Please schedule follow up appointment* 04/03/19  Yes Kentrail Shew, Venia Minks, MD  ferrous sulfate 325 (65 FE) MG EC tablet Take 325 mg by mouth once a week.   Yes [provider]  fexofenadine (ALLEGRA) 180 MG tablet Take 180 mg by mouth daily.   Yes [provider]  fluticasone (FLONASE) 50 MCG/ACT nasal spray SPRAY TWO SPRAYS IN EACH NOSTRIL ONCE DAILY 09/09/20  Yes Young, Clinton D, MD  hydrocortisone (ANUSOL-HC) 2.5 % rectal cream Place 1 application rectally 2 (two) times daily. 12/13/19  Yes Finola Rosal, Venia Minks, MD  insulin aspart (NOVOLOG) 100 UNIT/ML FlexPen 3 (three) times a day as needed. Slide in scale 07/13/17  Yes [provider]  insulin glargine (LANTUS) 100 units/mL SOLN Inject into the skin daily. 20 units   Yes [provider]  lisinopril (PRINIVIL,ZESTRIL) 5 MG tablet Take 5 mg by mouth daily. 02/14/17  Yes [provider]  metFORMIN (GLUCOPHAGE) 1000 MG tablet Take 1,000 mg by mouth 2 (two) times daily.   Yes [provider]  montelukast (SINGULAIR) 10 MG tablet Take 1 tablet (10 mg total) by mouth daily. 07/17/21  Yes Young, Tarri Fuller D, MD  Multiple Vitamin (MULTIVITAMIN) tablet Take 2 tablets by mouth daily. 2 gummies a day   Yes [provider]  ONETOUCH VERIO test strip 1 each 3 (three) times daily. 09/09/20  Yes [provider]  simvastatin (ZOCOR) 20 MG tablet Take 20 mg by mouth daily.   Yes [provider]  augmented betamethasone dipropionate (DIPROLENE-AF) 1.61 % cream 1 application. 2 (two) times daily as needed.    [provider]  levalbuterol Penne Lash HFA) 45 MCG/ACT inhaler Inhale 2 puffs into the lungs every 6 (six) hours as needed for wheezing. 12/03/20 01/02/21  Baird Lyons D, MD  naratriptan (AMERGE) 2.5 MG tablet Take 1 tablet (2.5 mg total) by mouth as needed. Take one (1) tablet at onset of headache; if returns or does not resolve, may repeat after 4 hours; do not exceed five (5) mg in 24 hours. 03/07/21   Jaclyn Prime, Collene Leyden, PA-C  Tiotropium Bromide Monohydrate (SPIRIVA RESPIMAT) 2.5 MCG/ACT AERS Inhale 2 puffs daily 12/03/20   Baird Lyons D, MD  Wheat Dextrin (BENEFIBER PO) Take by mouth. As needed    [provider]    Current Outpatient Medications  Medication Sig Dispense Refill   acyclovir (ZOVIRAX) 400 MG tablet Take 1 tablet (400 mg total) by mouth 2 (two) times daily. Increase to 2 tablets ('800mg'$ ) three times daily for 5 days with symptoms. 180 tablet 3   b complex vitamins capsule Take 1 capsule by mouth daily.     Biotin w/ Vitamins C & E (HAIR/SKIN/NAILS PO) Take 2 tablets by mouth daily. 2 gummies a day     Continuous Blood Gluc Receiver (FREESTYLE LIBRE READER) DEVI by Does not apply route. In Left Arm     esomeprazole (NEXIUM) 40 MG capsule Take 1 capsule (40 mg total) by mouth daily. *Please schedule follow up appointment* 90 capsule 0   ferrous sulfate 325 (65 FE) MG EC tablet Take 325 mg by mouth once a week.     fexofenadine (ALLEGRA) 180 MG tablet Take 180 mg by mouth daily.     fluticasone (FLONASE) 50 MCG/ACT nasal spray SPRAY TWO SPRAYS IN EACH NOSTRIL ONCE DAILY 48 mL 3   hydrocortisone (ANUSOL-HC) 2.5 % rectal cream Place 1 application rectally 2 (two) times daily. 30 g 1   insulin aspart (NOVOLOG) 100 UNIT/ML FlexPen 3 (three) times a day as needed. Slide in scale     insulin glargine (LANTUS) 100 units/mL SOLN Inject into the skin daily. 20 units     lisinopril (PRINIVIL,ZESTRIL) 5 MG tablet Take 5 mg by mouth daily.  2   metFORMIN (GLUCOPHAGE) 1000 MG  tablet Take 1,000 mg by mouth 2 (two) times daily.     montelukast (SINGULAIR) 10 MG tablet Take 1 tablet (10 mg total) by mouth daily. 90 tablet 3   Multiple Vitamin (MULTIVITAMIN) tablet Take 2 tablets by mouth daily. 2 gummies a day     ONETOUCH VERIO test strip 1 each 3 (three) times daily.     simvastatin (ZOCOR) 20 MG tablet Take 20 mg by mouth daily.     augmented betamethasone dipropionate (DIPROLENE-AF) 0.96 % cream 1 application. 2 (two) times daily as needed.     levalbuterol (  XOPENEX HFA) 45 MCG/ACT inhaler Inhale 2 puffs into the lungs every 6 (six) hours as needed for wheezing. 15 g 6   naratriptan (AMERGE) 2.5 MG tablet Take 1 tablet (2.5 mg total) by mouth as needed. Take one (1) tablet at onset of headache; if returns or does not resolve, may repeat after 4 hours; do not exceed five (5) mg in 24 hours. 18 tablet 6   Tiotropium Bromide Monohydrate (SPIRIVA RESPIMAT) 2.5 MCG/ACT AERS Inhale 2 puffs daily 4 g 6   Wheat Dextrin (BENEFIBER PO) Take by mouth. As needed     Current Facility-Administered Medications  Medication Dose Route Frequency Provider Last Rate Last Admin   0.9 %  sodium chloride infusion  500 mL Intravenous Once Mauri Pole, MD        Allergies as of 08/21/2021 - Review Complete 08/21/2021  Allergen Reaction Noted   Ciprofloxacin     Doxycycline  04/30/2011   Flagyl [metronidazole]  01/16/2013   Penicillins     Amoxicillin Swelling and Rash 01/16/2013    Family History  Problem Relation Age of Onset   Cancer Mother        bile duct liver cancer   Thyroid disease Mother        thryoid removed   Cancer Father        leukemia   Asthma Sister        SEVERE CHRONIC   Cancer Maternal Grandmother        stomach that started around rectum   Heart attack Maternal Grandfather    Stroke Paternal Grandmother    Cancer Paternal Grandfather        started in kidneys   Esophageal cancer Neg Hx     Social History   Socioeconomic History    Marital status: Married    Spouse name: Not on file   Number of children: Not on file   Years of education: Not on file   Highest education level: Not on file  Occupational History    Employer: [employer]  Tobacco Use   Smoking status: Former    Types: Cigarettes   Smokeless tobacco: Never   Tobacco comments:    Did it for a few months  Vaping Use   Vaping Use: Never used  Substance and Sexual Activity   Alcohol use: Yes    Comment: 1 a month   Drug use: No   Sexual activity: Yes    Partners: Male    Birth control/protection: Surgical    Comment: supracervical hysterectomy  Other Topics Concern   Not on file  Social History Narrative   Not on file   Social Determinants of Health   Financial Resource Strain: Not on file  Food Insecurity: No Food Insecurity (01/19/2019)   Hunger Vital Sign    Worried About Running Out of Food in the Last Year: Never true    Ran Out of Food in the Last Year: Never true  Transportation Needs: No Transportation Needs (01/19/2019)   PRAPARE - Hydrologist (Medical): No    Lack of Transportation (Non-Medical): No  Physical Activity: Not on file  Stress: Not on file  Social Connections: Not on file  Intimate Partner Violence: Not on file    Review of Systems:  All other review of systems negative except as mentioned in the HPI.  Physical Exam: Vital signs in last 24 hours: BP 120/73   Pulse 75   Temp 98 F (  36.7 C)   Resp 13   Ht '5\' 3"'$  (1.6 m)   Wt 145 lb (65.8 kg)   LMP 01/02/2001 Comment: supracervical hysterectomy  SpO2 100%   BMI 25.69 kg/m  General:   Alert, NAD Lungs:  Clear .   Heart:  Regular rate and rhythm Abdomen:  Soft, nontender and nondistended. Neuro/Psych:  Alert and cooperative. Normal mood and affect. A and O x 3  Reviewed labs, radiology imaging, old records and pertinent past GI work up  Patient is appropriate for planned procedure(s) and anesthesia in an  ambulatory setting   K. Denzil Magnuson , MD [phone]

## 2021-08-21 NOTE — Op Note (Signed)
[place] Patient Name: Sonia [name] Procedure Date: 08/21/2021 9:12 AM MRN: [id number] Endoscopist: Mauri Pole , MD Age: 66 Referring MD:  Date of Birth: 1955-08-24 Gender: Female Account #: [id number] Procedure:                Colonoscopy Indications:              Unexplained iron deficiency anemia Medicines:                Monitored Anesthesia Care Procedure:                Pre-Anesthesia Assessment:                           - Prior to the procedure, a History and Physical                            was performed, and patient medications and                            allergies were reviewed. The patient's tolerance of                            previous anesthesia was also reviewed. The risks                            and benefits of the procedure and the sedation                            options and risks were discussed with the patient.                            All questions were answered, and informed consent                            was obtained. Prior Anticoagulants: The patient has                            taken no previous anticoagulant or antiplatelet                            agents. ASA Grade Assessment: II - A patient with                            mild systemic disease. After reviewing the risks                            and benefits, the patient was deemed in                            satisfactory condition to undergo the procedure.                           After obtaining informed consent, the colonoscope  was passed under direct vision. Throughout the                            procedure, the patient's blood pressure, pulse, and                            oxygen saturations were monitored continuously. The                            PCF-HQ190L Colonoscope was introduced through the                            anus and advanced to the the cecum, identified by                            appendiceal orifice  and ileocecal valve. The                            colonoscopy was performed without difficulty. The                            patient tolerated the procedure well. The quality                            of the bowel preparation was good. The ileocecal                            valve, appendiceal orifice, and rectum were                            photographed. Scope In: 9:28:40 AM Scope Out: 9:44:46 AM Scope Withdrawal Time: 0 hours 11 minutes 13 seconds  Total Procedure Duration: 0 hours 16 minutes 6 seconds  Findings:                 The perianal and digital rectal examinations were                            normal.                           A 1 mm polyp was found in the ascending colon. The                            polyp was sessile. The polyp was removed with a                            cold biopsy forceps. Resection and retrieval were                            complete.                           Two sessile polyps were found in the transverse  colon and ileocecal valve. The polyps were 4 to 5                            mm in size. These polyps were removed with a cold                            snare. Resection and retrieval were complete.                           A few small-mouthed diverticula were found in the                            sigmoid colon.                           Non-bleeding external and internal hemorrhoids were                            found during retroflexion. The hemorrhoids were                            small. Complications:            No immediate complications. Estimated Blood Loss:     Estimated blood loss was minimal. Impression:               - One 1 mm polyp in the ascending colon, removed                            with a cold biopsy forceps. Resected and retrieved.                           - Two 4 to 5 mm polyps in the transverse colon and                            at the ileocecal valve, removed with a cold  snare.                            Resected and retrieved.                           - Diverticulosis in the sigmoid colon.                           - Non-bleeding external and internal hemorrhoids. Recommendation:           - Patient has a contact number available for                            emergencies. The signs and symptoms of potential                            delayed complications were discussed with the  patient. Return to normal activities tomorrow.                            Written discharge instructions were provided to the                            patient.                           - Resume previous diet.                           - Continue present medications.                           - Await pathology results.                           - Repeat colonoscopy in 3 - 5 years for                            surveillance based on pathology results.                           - See the other procedure note for documentation of                            additional recommendations. Mauri Pole, MD 08/21/2021 9:52:31 AM This report has been signed electronically.

## 2021-08-21 NOTE — Telephone Encounter (Signed)
Dr Silverio Decamp Please advise  pt had procedure today

## 2021-08-21 NOTE — Op Note (Signed)
[place] Patient Name: Sonia [name] Procedure Date: 08/21/2021 9:12 AM MRN: [id number] Endoscopist: Mauri Pole , MD Age: 66 Referring MD:  Date of Birth: 23-Oct-1955 Gender: Female Account #: [id number] Procedure:                Upper GI endoscopy Indications:              Suspected upper gastrointestinal bleeding in                            patient with unexplained iron deficiency anemia Medicines:                Monitored Anesthesia Care Procedure:                Pre-Anesthesia Assessment:                           - Prior to the procedure, a History and Physical                            was performed, and patient medications and                            allergies were reviewed. The patient's tolerance of                            previous anesthesia was also reviewed. The risks                            and benefits of the procedure and the sedation                            options and risks were discussed with the patient.                            All questions were answered, and informed consent                            was obtained. Prior Anticoagulants: The patient has                            taken no previous anticoagulant or antiplatelet                            agents. ASA Grade Assessment: II - A patient with                            mild systemic disease. After reviewing the risks                            and benefits, the patient was deemed in                            satisfactory condition to undergo the procedure.  After obtaining informed consent, the endoscope was                            passed under direct vision. Throughout the                            procedure, the patient's blood pressure, pulse, and                            oxygen saturations were monitored continuously. The                            Endoscope was introduced through the mouth, and                            advanced  to the second part of duodenum. The upper                            GI endoscopy was accomplished without difficulty.                            The patient tolerated the procedure well. Scope In: Scope Out: Findings:                 The Z-line was regular and was found 40 cm from the                            incisors.                           No gross lesions were noted in the entire esophagus.                           Multiple 5 to 20 mm mucosal papules (nodules) with                            brisk bleeding on biopsies were found in the cardia                            and in the gastric fundus. Biopsies were taken with                            a cold forceps for histology.                           Patchy mild inflammation characterized by                            congestion (edema) and erythema was found in the                            gastric antrum. Biopsies were taken with a cold  forceps for Helicobacter pylori testing.                           The examined duodenum was normal. Complications:            No immediate complications. Estimated Blood Loss:     Estimated blood loss was minimal. Impression:               - Z-line regular, 40 cm from the incisors.                           - No gross lesions in esophagus.                           - Multiple mucosal papules (nodules) found in the                            stomach. Biopsied.                           - Gastritis. Biopsied.                           - Normal examined duodenum. Recommendation:           - Patient has a contact number available for                            emergencies. The signs and symptoms of potential                            delayed complications were discussed with the                            patient. Return to normal activities tomorrow.                            Written discharge instructions were provided to the                            patient.                            - Resume previous diet.                           - Continue present medications.                           - Await pathology results.                           - Repeat upper endoscopy after studies are complete                            for surveillance based on pathology results.                           -  Use sucralfate tablets 1 gram PO QID for 7 days.                            No refills                           - Use Nexium (esomeprazole) 40 mg PO BID for 2                            months, then reduce to once daily. Please send Rx                            for Nexium '40mg'$  twice daily X 60 capsules with 2                            refills.                           - No ibuprofen, naproxen, or other non-steroidal                            anti-inflammatory drugs. Mauri Pole, MD 08/21/2021 9:59:07 AM This report has been signed electronically.

## 2021-08-21 NOTE — Patient Instructions (Addendum)
Handouts on hemorrhoids, diverticulosis, and polyps given to patient. Await pathology results. Resume previous diet and continue present medications. Repeat colonoscopy in 3-5 years for surveillance based off of pathology results. Pick up prescription for Sucralfate and Nexium from pharamcy - take sucralfate 1 gram four times a day for 7 days and take nexium 40 mg twice a daily for 2 months then reduce to once a daily. No Ibuprofen, naproxen, or other non-steroidal anti-inflammatory medications (NSAID's)   YOU HAD AN ENDOSCOPIC PROCEDURE TODAY AT [facility]:   Refer to the procedure report that was given to you for any specific questions about what was found during the examination.  If the procedure report does not answer your questions, please call your gastroenterologist to clarify.  If you requested that your care partner not be given the details of your procedure findings, then the procedure report has been included in a sealed envelope for you to review at your convenience later.  YOU SHOULD EXPECT: Some feelings of bloating in the abdomen. Passage of more gas than usual.  Walking can help get rid of the air that was put into your GI tract during the procedure and reduce the bloating. If you had a lower endoscopy (such as a colonoscopy or flexible sigmoidoscopy) you may notice spotting of blood in your stool or on the toilet paper. If you underwent a bowel prep for your procedure, you may not have a normal bowel movement for a few days.  Please Note:  You might notice some irritation and congestion in your nose or some drainage.  This is from the oxygen used during your procedure.  There is no need for concern and it should clear up in a day or so.  SYMPTOMS TO REPORT IMMEDIATELY:  Following lower endoscopy (colonoscopy or flexible sigmoidoscopy):  Excessive amounts of blood in the stool  Significant tenderness or worsening of abdominal pains  Swelling of the abdomen that is  new, acute  Fever of 100F or higher  Following upper endoscopy (EGD)  Vomiting of blood or coffee ground material  New chest pain or pain under the shoulder blades  Painful or persistently difficult swallowing  New shortness of breath  Fever of 100F or higher  Black, tarry-looking stools  For urgent or emergent issues, a gastroenterologist can be reached at any hour by calling [phone]. Do not use MyChart messaging for urgent concerns.    DIET:  We do recommend a small meal at first, but then you may proceed to your regular diet.  Drink plenty of fluids but you should avoid alcoholic beverages for 24 hours.  ACTIVITY:  You should plan to take it easy for the rest of today and you should NOT DRIVE or use heavy machinery until tomorrow (because of the sedation medicines used during the test).    FOLLOW UP: Our staff will call the number listed on your records the next business day following your procedure.  We will call around 7:15- 8:00 am to check on you and address any questions or concerns that you may have regarding the information given to you following your procedure. If we do not reach you, we will leave a message.  If you develop any symptoms (ie: fever, flu-like symptoms, shortness of breath, cough etc.) before then, please call [phone].  If you test positive for Covid 19 in the 2 weeks post procedure, please call and report this information to [place].    If any biopsies were taken you will  be contacted by phone or by letter within the next 1-3 weeks.  Please call us at [phone] if you have not heard about the biopsies in 3 weeks.    SIGNATURES/CONFIDENTIALITY: You and/or your care partner have signed paperwork which will be entered into your electronic medical record.  These signatures attest to the fact that that the information above on your After Visit Summary has been reviewed and is understood.  Full responsibility of the confidentiality of this discharge  information lies with you and/or your care-partner.

## 2021-08-21 NOTE — Progress Notes (Signed)
Report to PACU, RN, vss, BBS= Clear.  

## 2021-08-22 ENCOUNTER — Telehealth: Payer: Self-pay

## 2021-08-22 ENCOUNTER — Other Ambulatory Visit: Payer: Self-pay

## 2021-08-22 MED ORDER — ESOMEPRAZOLE MAGNESIUM 40 MG PO CPDR: DELAYED_RELEASE_CAPSULE | ORAL | 2 refills | Status: AC

## 2021-08-22 MED ORDER — SUCRALFATE 1 G PO TABS: ORAL_TABLET | ORAL | 0 refills | Status: DC

## 2021-08-22 NOTE — Telephone Encounter (Signed)
Thank you for switching the Rx.

## 2021-08-22 NOTE — Telephone Encounter (Signed)
  Follow up Call-     08/21/2021    8:30 AM  Call back number  Post procedure Call Back phone  # [phone]  Permission to leave phone message Yes     Patient questions:  Do you have a fever, pain , or abdominal swelling? No. Pain Score  0 *  Have you tolerated food without any problems? Yes.    Have you been able to return to your normal activities? Yes.    Do you have any questions about your discharge instructions: Diet   No. Medications  Yes.   Follow up visit  No.  Do you have questions or concerns about your Care? Yes.    Actions: * If pain score is 4 or above: No action needed, pain <4. Patient left a message yesterday about the medications that were prescribed.  This RN advised patient that Dr. Silverio Decamp will address this issue and will adive her.  Patient verbalized understanding.

## 2021-08-22 NOTE — Telephone Encounter (Signed)
I corrected the Nexium prescription and changed the Carafate to tablets crushed to make slurry.

## 2021-08-25 ENCOUNTER — Telehealth: Payer: Self-pay | Admitting: Gastroenterology

## 2021-08-25 NOTE — Telephone Encounter (Signed)
I called the [facility] and called in #7 more carafate to complete her 7 days of QID dosing of carafate tablets. I have left a detailed message for Sharina with this information.

## 2021-09-09 ENCOUNTER — Telehealth: Payer: Self-pay | Admitting: Gastroenterology

## 2021-09-09 NOTE — Telephone Encounter (Signed)
PT is calling to get the biopsy results from colonoscopy/EGD. Please reach out to advise. Thank you.

## 2021-09-10 NOTE — Telephone Encounter (Signed)
Her biopsy results are in Epic now. Please advise and I will call her.

## 2021-09-10 NOTE — Telephone Encounter (Signed)
Patient called to follow up on message that was sent yesterday. Please advise.

## 2021-09-11 NOTE — Telephone Encounter (Signed)
PT returning call for biopsy results. Please advise. Thank you

## 2021-09-11 NOTE — Telephone Encounter (Signed)
Spoke with the patient. Advised the biopsy results are being reviewed. No signs of cancer or precancerous biopsies. No H Pylori infection. She reports she is taking Nexium BID and does feel it is improving her symptoms. She will wait for the letter from the doctor or a phone call. Patient appreciative of this information.

## 2021-09-17 ENCOUNTER — Encounter: Payer: Self-pay | Admitting: Gastroenterology

## 2021-10-08 ENCOUNTER — Encounter: Payer: Self-pay | Admitting: Internal Medicine

## 2021-10-08 ENCOUNTER — Ambulatory Visit: Payer: HMO | Attending: Internal Medicine | Admitting: Internal Medicine

## 2021-10-08 VITALS — BP 129/78 | HR 67 | Ht 63.0 in | Wt 147.2 lb

## 2021-10-08 DIAGNOSIS — I2584 Coronary atherosclerosis due to calcified coronary lesion: Secondary | ICD-10-CM | POA: Diagnosis not present

## 2021-10-08 DIAGNOSIS — E785 Hyperlipidemia, unspecified: Secondary | ICD-10-CM | POA: Diagnosis not present

## 2021-10-08 DIAGNOSIS — Z7189 Other specified counseling: Secondary | ICD-10-CM

## 2021-10-08 DIAGNOSIS — I2510 Atherosclerotic heart disease of native coronary artery without angina pectoris: Secondary | ICD-10-CM

## 2021-10-08 DIAGNOSIS — R002 Palpitations: Secondary | ICD-10-CM | POA: Diagnosis not present

## 2021-10-08 DIAGNOSIS — I10 Essential (primary) hypertension: Secondary | ICD-10-CM | POA: Diagnosis not present

## 2021-10-08 NOTE — Progress Notes (Signed)
Cardiology Office Note:    Date:  10/08/2021   ID:  Sonia [name], DOB 1955-06-22, MRN [id number]  PCP:  Deland Pretty, MD  Cardiologist:  Elouise Munroe, MD  Electrophysiologist:  None   Referring MD: Deland Pretty, MD   Chief Complaint/Reason for Referral: Coronary artery calcifications  History of Present Illness:    Sonia [name] is a 66 y.o. female with a history of coronary artery calcifications noted on calcium scoring, hypertension, hyperlipidemia, GERD, diabetes mellitus, here for follow-up.  Previously here for cardiovascular review in the setting of elevated coronary artery calcium score.  I have independently reviewed the images from her calcium scoring performed in May 2022.  Total coronary artery calcium score of 16 which places her in the 60th percentile for age and gender matched peers.  Was started on ASA 81 mg daily, unable to tolerate.  Simvastatin started after dx of diabetes. Uses insulin - rare palpitations after Novolog. Tolerating simvastatin well with good lipid control, LDL at goal.  Today, she states she has been fine. She has experienced a "little flutter" that felt like some flutters she'd had years ago when she would drink too much caffeine. She has experienced it in the past week and at random, infrequent times before that, and it lasts for only a few seconds each time.   She reports that she a short while ago she was waking up with headaches. She states she had been drinking coffee-based shakes at night when she was feeling hungry when trying to sleep. After such frequent headaches, she stopped drinking caffeine and her headaches have not returned since then.  She denies any chest pain, shortness of breath, or peripheral edema. No lightheadedness, syncope, orthopnea, or PND.    Past Medical History:  Diagnosis Date   Abnormal Pap smear 1990   Allergic rhinitis, cause unspecified    Allergy    SEASONAL   Anemia    Anxiety    DENIES    Asthma    Diabetes mellitus without complication ([place])    on insulin   Fibroid    GERD (gastroesophageal reflux disease)    Hyperlipidemia    Hypertension    DENIES   Polyp of nasal cavity    Skin cancer (melanoma) ([place])    STD (sexually transmitted disease)    HSV2   Unspecified asthma(493.90)     Past Surgical History:  Procedure Laterality Date   COLONOSCOPY     CRYOTHERAPY  1990   no HPV changes, no dysplasia, chronic cervicitis   LAPAROSCOPIC OVARIAN CYSTECTOMY     bilateral 2004   MELANOMA EXCISION WITH SENTINEL LYMPH NODE BIOPSY     left leg   MOLE REMOVAL  06/11/2021   was abnomal on right leg and was removed by dermatologist   [place] Left 2006   SUPRACERVICAL ABDOMINAL HYSTERECTOMY  2002   cervix & ovaries retained   TONSILLECTOMY     UPPER GASTROINTESTINAL ENDOSCOPY      Current Medications: Current Meds  Medication Sig   acyclovir (ZOVIRAX) 400 MG tablet Take 1 tablet (400 mg total) by mouth 2 (two) times daily. Increase to 2 tablets ('800mg'$ ) three times daily for 5 days with symptoms.   augmented betamethasone dipropionate (DIPROLENE-AF) 2.40 % cream 1 application. 2 (two) times daily as needed.   b complex vitamins capsule Take 1 capsule by mouth daily.   Biotin w/ Vitamins C & E (HAIR/SKIN/NAILS PO) Take 2 tablets by mouth daily. 2 gummies  a day   Continuous Blood Gluc Receiver (FREESTYLE LIBRE READER) DEVI by Does not apply route. In Left Arm   esomeprazole (NEXIUM) 40 MG capsule 1 capsule twice daily 30 minutes before meals for 2 months then take ac breakfast daily   ferrous sulfate 325 (65 FE) MG EC tablet Take 325 mg by mouth once a week.   fexofenadine (ALLEGRA) 180 MG tablet Take 180 mg by mouth daily.   fluticasone (FLONASE) 50 MCG/ACT nasal spray SPRAY TWO SPRAYS IN EACH NOSTRIL ONCE DAILY   hydrocortisone (ANUSOL-HC) 2.5 % rectal cream Place 1 application rectally 2 (two) times daily.   insulin aspart (NOVOLOG) 100 UNIT/ML FlexPen 3  (three) times a day as needed. Slide in scale   insulin glargine (LANTUS) 100 units/mL SOLN Inject into the skin daily. 20 units   levalbuterol (XOPENEX HFA) 45 MCG/ACT inhaler Inhale 2 puffs into the lungs every 6 (six) hours as needed for wheezing.   lisinopril (PRINIVIL,ZESTRIL) 5 MG tablet Take 5 mg by mouth daily.   metFORMIN (GLUCOPHAGE) 1000 MG tablet Take 1,000 mg by mouth 2 (two) times daily.   montelukast (SINGULAIR) 10 MG tablet Take 1 tablet (10 mg total) by mouth daily.   Multiple Vitamin (MULTIVITAMIN) tablet Take 2 tablets by mouth daily. 2 gummies a day   naratriptan (AMERGE) 2.5 MG tablet Take 1 tablet (2.5 mg total) by mouth as needed. Take one (1) tablet at onset of headache; if returns or does not resolve, may repeat after 4 hours; do not exceed five (5) mg in 24 hours.   ONETOUCH VERIO test strip 1 each 3 (three) times daily.   simvastatin (ZOCOR) 20 MG tablet Take 20 mg by mouth daily.   Tiotropium Bromide Monohydrate (SPIRIVA RESPIMAT) 2.5 MCG/ACT AERS Inhale 2 puffs daily   Wheat Dextrin (BENEFIBER PO) Take by mouth. As needed     Allergies:   Ciprofloxacin, Doxycycline, Flagyl [metronidazole], Penicillins, and Amoxicillin   Social History   Tobacco Use   Smoking status: Former    Types: Cigarettes   Smokeless tobacco: Never   Tobacco comments:    Did it for a few months  Vaping Use   Vaping Use: Never used  Substance Use Topics   Alcohol use: Yes    Comment: 1 a month   Drug use: No     Family History: The patient's family history includes Asthma in her sister; Cancer in her father, maternal grandmother, mother, and paternal grandfather; Heart attack in her maternal grandfather; Stroke in her paternal grandmother; Thyroid disease in her mother. There is no history of Esophageal cancer.  ROS:   Please see the history of present illness.  (+) Palpitations   All other systems reviewed and are negative.  EKGs/Labs/Other Studies Reviewed:    The following  studies were reviewed today:  EKG:  EKG is personally reviewed.  10/08/21: Normal Sinus Rhythm.  10/02/20: NSR, sinus arrhythmia, rate 74 bpm  Imaging studies that I have independently reviewed today: Coronary calcium scoring 06/19/2020.   Recent Labs: No results found for requested labs within last 365 days.  Recent Lipid Panel    Component Value Date/Time   CHOL  11/16/2007 0415    144        ATP III CLASSIFICATION:  <200     mg/dL   Desirable  200-239  mg/dL   Borderline High  >=240    mg/dL   High   TRIG 73 11/16/2007 0415   HDL 43 11/16/2007 0415  CHOLHDL 3.3 11/16/2007 0415   VLDL 15 11/16/2007 0415   LDLCALC  11/16/2007 0415    86        Total Cholesterol/HDL:CHD Risk Coronary Heart Disease Risk Table                     Men   Women  1/2 Average Risk   3.4   3.3    Physical Exam:    VS:  BP 129/78   Pulse 67   Ht '5\' 3"'$  (1.6 m)   Wt 147 lb 3.2 oz (66.8 kg)   LMP 01/02/2001 Comment: supracervical hysterectomy  SpO2 99%   BMI 26.08 kg/m     Wt Readings from Last 5 Encounters:  10/08/21 147 lb 3.2 oz (66.8 kg)  08/21/21 145 lb (65.8 kg)  07/28/21 145 lb 12.8 oz (66.1 kg)  07/17/21 149 lb (67.6 kg)  06/17/21 149 lb 6 oz (67.8 kg)    Constitutional: No acute distress Eyes: sclera non-icteric, normal conjunctiva and lids ENMT: normal dentition, moist mucous membranes Cardiovascular: regular rhythm, normal rate, no murmurs. S1 and S2 normal. Radial pulses normal bilaterally. No jugular venous distention.  Respiratory: clear to auscultation bilaterally GI : normal bowel sounds, soft and nontender. No distention.   MSK: extremities warm, well perfused. No edema.  NEURO: grossly nonfocal exam, moves all extremities. PSYCH: alert and oriented x 3, normal mood and affect.   ASSESSMENT:    1. Hyperlipidemia, unspecified hyperlipidemia type   2. Coronary artery calcification   3. Hypertension, unspecified type   4. Cardiac risk counseling   5. Palpitations     PLAN:    Coronary artery calcification Cardiac risk counseling -She did not tolerate aspirin 81 mg daily due to GI symptoms.  Okay to hold at this time with low calcium score though ideally if she were able to resume for secondary prevention of CAD that would be optimal.  She will consider in the future. -Continue simvastatin, good lipid control.  We discussed intensification of statin therapy.  She is hesitant to try Crestor given complications her mother had on high intensity statin therapy.  Since she has good lipid control and remains asymptomatic from a cardiopulmonary standpoint with a modest coronary artery calcium score, okay to continue simvastatin at this time.  Palpitations - infrequent and improved but she will observe and call if she wants to pursue monitor.  Hypertension, unspecified type - Plan: EKG 12-Lead -Blood pressure is well controlled today on lisinopril 5 mg daily, continue at current dose.  Hyperlipidemia, unspecified hyperlipidemia type -Continue simvastatin 20 mg daily.   Total time of encounter: 30 minutes total time of encounter, including 20 minutes spent in face-to-face patient care on the date of this encounter. This time includes coordination of care and counseling regarding above mentioned problem list. Remainder of non-face-to-face time involved reviewing chart documents/testing relevant to the patient encounter and documentation in the medical record. I have independently reviewed documentation from referring provider.   Follow up: 1 year.  Cherlynn Kaiser, MD, [place]   Shared Decision Making/Informed Consent:       Medication Adjustments/Labs and Tests Ordered: Current medicines are reviewed at length with the patient today.  Concerns regarding medicines are outlined above.   Orders Placed This Encounter  Procedures   EKG 12-Lead    No orders of the defined types were placed in this encounter.   Patient  Instructions  Medication Instructions:  No Changes In  Medications at this time.  *If you need a refill on your cardiac medications before your next appointment, please call your pharmacy*  Lab Work: None Ordered At This Time.   If you have labs (blood work) drawn today and your tests are completely normal, you will receive your results only by: [place] (if you have MyChart) OR A paper copy in the mail If you have any lab test that is abnormal or we need to change your treatment, we will call you to review the results.  Testing/Procedures: None Ordered At This Time.   Follow-Up: At [facility], you and your health needs are our priority.  As part of our continuing mission to provide you with exceptional heart care, we have created designated Provider Care Teams.  These Care Teams include your primary Cardiologist (physician) and Advanced Practice Providers (APPs -  Physician Assistants and Nurse Practitioners) who all work together to provide you with the care you need, when you need it.  Your next appointment:   1 year(s)  The format for your next appointment:   In Person  Provider:   Elouise Munroe, MD     Other Instructions PLEASE CALL IF [place] [facility] [place] PALPITATIONS            I,Breanna Adamick,acting as a scribe for Elouise Munroe, MD.,have documented all relevant documentation on the behalf of Elouise Munroe, MD,as directed by  Elouise Munroe, MD while in the presence of Elouise Munroe, MD.   I, Elouise Munroe, MD, have reviewed all documentation for the visit on 10/08/2021. The documentation on today's date of service for the exam, diagnosis, procedures, and orders are all accurate and complete.

## 2021-10-08 NOTE — Patient Instructions (Signed)
Medication Instructions:  No Changes In Medications at this time.  *If you need a refill on your cardiac medications before your next appointment, please call your pharmacy*  Lab Work: None Ordered At This Time.   If you have labs (blood work) drawn today and your tests are completely normal, you will receive your results only by: [place] (if you have MyChart) OR A paper copy in the mail If you have any lab test that is abnormal or we need to change your treatment, we will call you to review the results.  Testing/Procedures: None Ordered At This Time.   Follow-Up: At [facility], you and your health needs are our priority.  As part of our continuing mission to provide you with exceptional heart care, we have created designated Provider Care Teams.  These Care Teams include your primary Cardiologist (physician) and Advanced Practice Providers (APPs -  Physician Assistants and Nurse Practitioners) who all work together to provide you with the care you need, when you need it.  Your next appointment:   1 year(s)  The format for your next appointment:   In Person  Provider:   Elouise Munroe, MD     Other Instructions PLEASE CALL IF YOU WOULD LIKE TO SCHEDULE 2 WEEK HEART MONITOR FOR PALPITATIONS

## 2021-10-16 IMAGING — CT CT CARDIAC CORONARY ARTERY CALCIUM SCORE
3 series · 14 of 20 positions shown, 16 images · non-contrast
Comparison: None.

CLINICAL DATA: 64-year-old Caucasian female with hyperlipidemia.

EXAM:
CT CARDIAC CORONARY ARTERY CALCIUM SCORE
TECHNIQUE: Non-contrast imaging through the heart was performed using
prospective ECG gating. Image post processing was performed on an
independent workstation, allowing for quantitative analysis of the
heart and coronary arteries. Note that this exam targets the heart
and the chest was not imaged in its entirety.

[Series 2: calcium scoring 2.00 qr36 bestdiast 71% hrt calciu · axial · 0.34mm/px · z∈[+1776,+1858]mm · 4 of 69 slices shown]
[im 14/69  vessel]
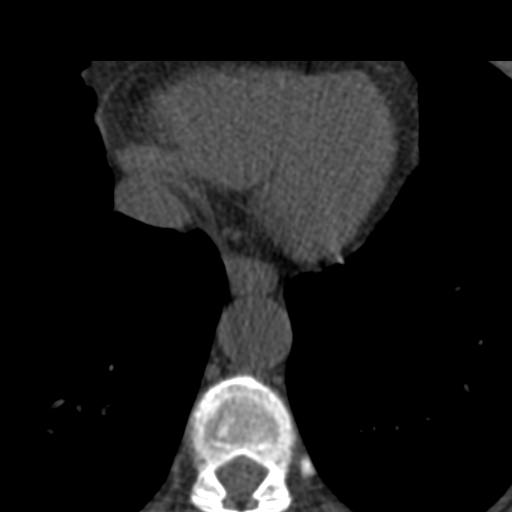
[im 28/69  vessel]
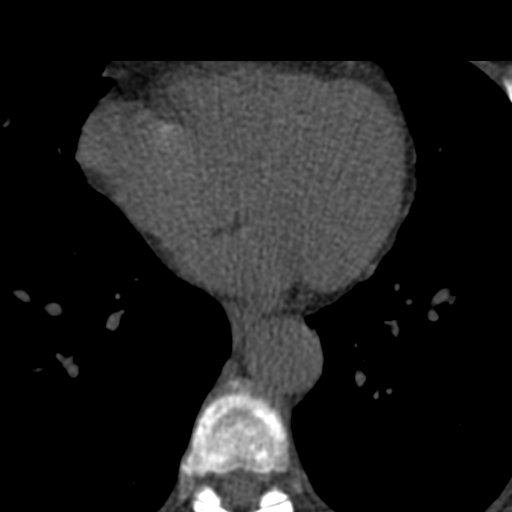
[im 41/69  vessel]
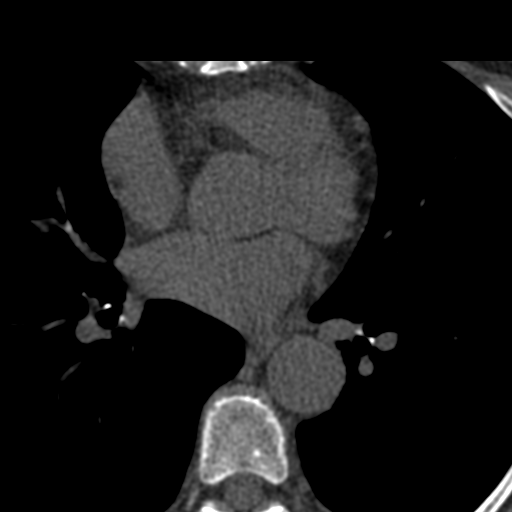
[im 55/69  vessel]
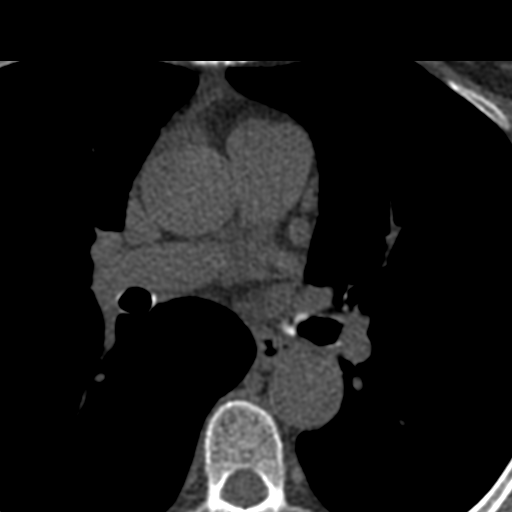

[Series 3: calcium scoring 2.00 br40 bestdiast 71% axial · axial · 0.52mm/px · z∈[+1771,+1863]mm · 5 of 70 slices shown, 7 images]
[im 12/70  vessel]
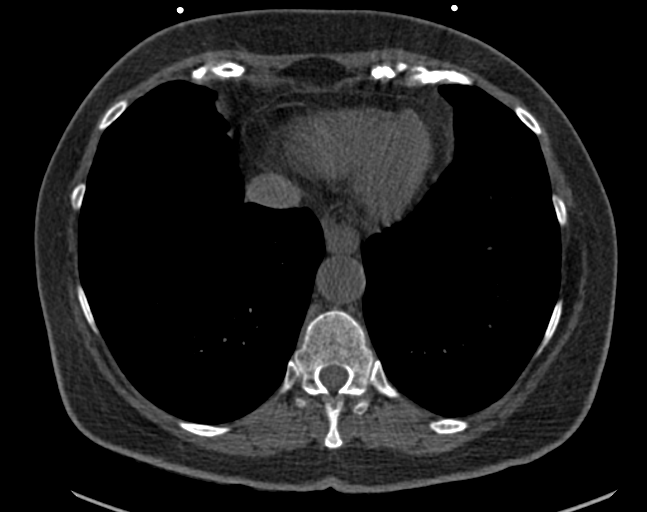
[im 12/70  lung]
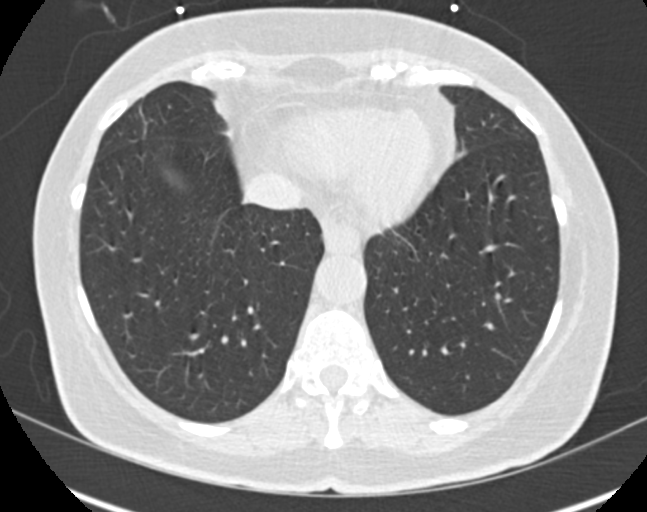
[im 24/70  vessel]
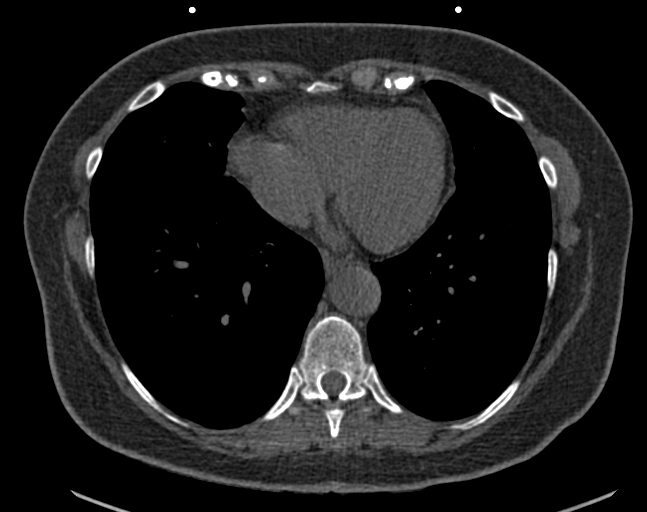
[im 35/70  vessel]
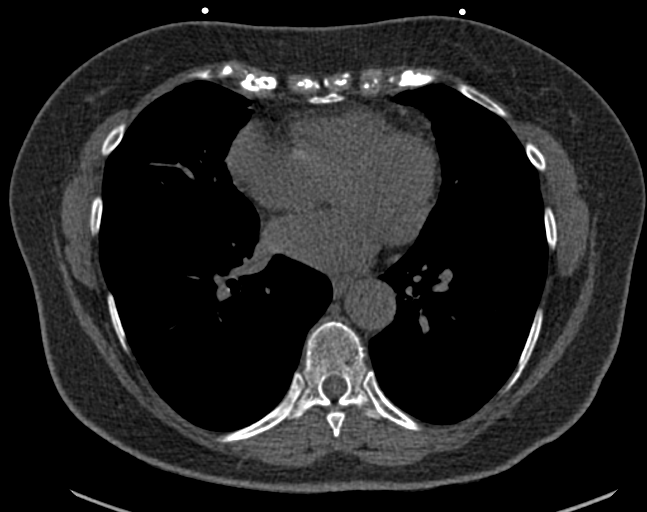
[im 47/70  vessel]
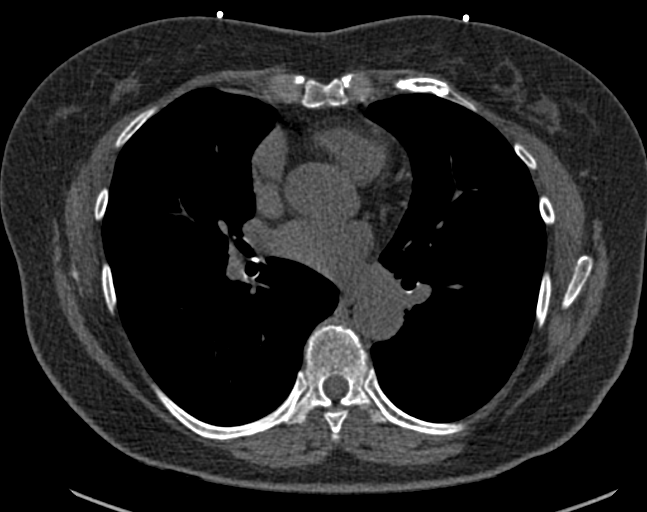
[im 58/70  vessel]
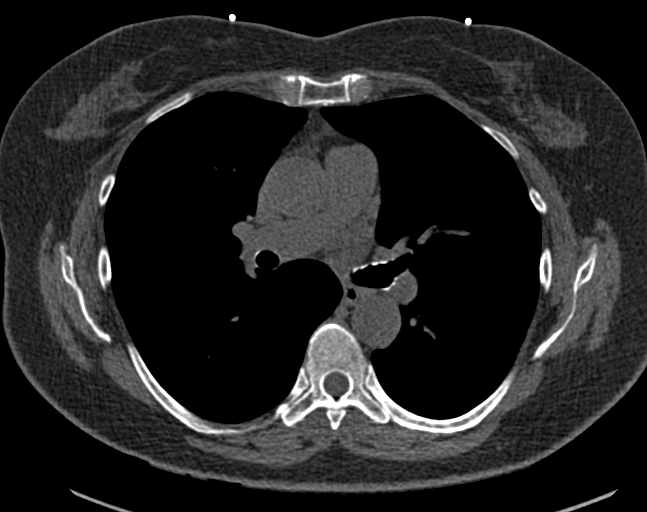
[im 58/70  lung]
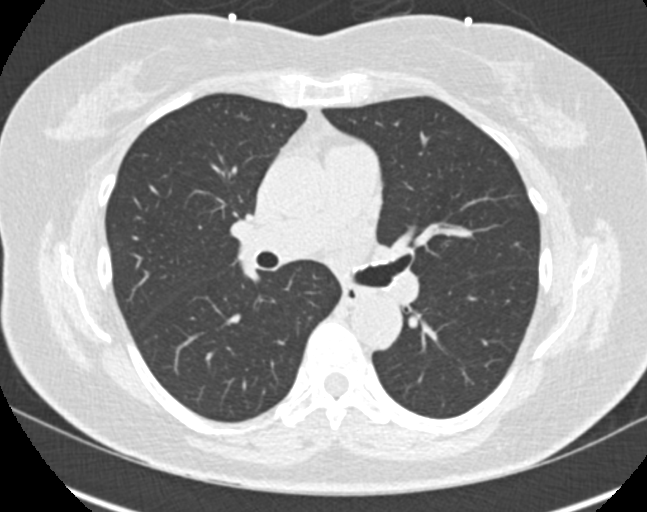

[Series 9: calcium scoring 2.00 br60 bestdiast 71% lungs · axial · 0.53mm/px · z∈[+1772,+1862]mm · 5 of 69 slices shown]
[im 12/69  vessel]
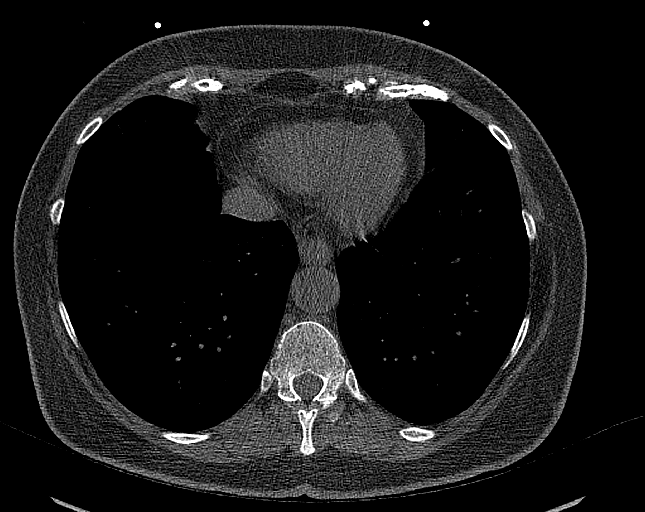
[im 23/69  vessel]
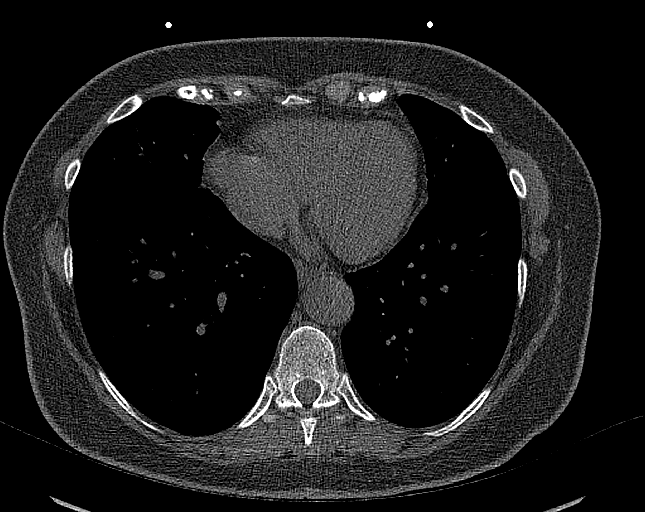
[im 35/69  vessel]
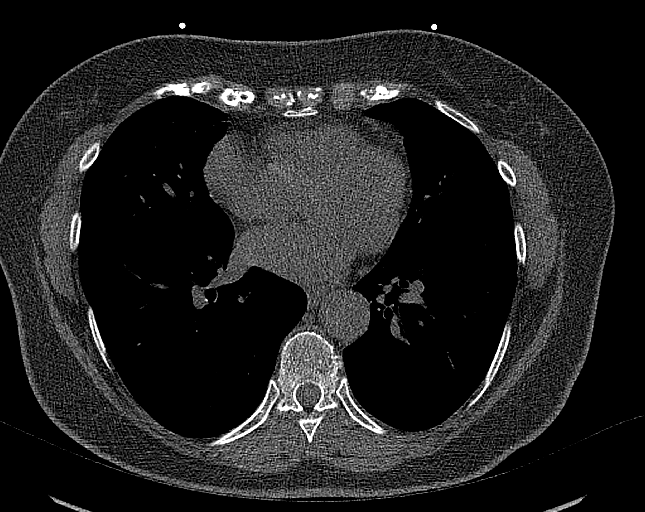
[im 46/69  vessel]
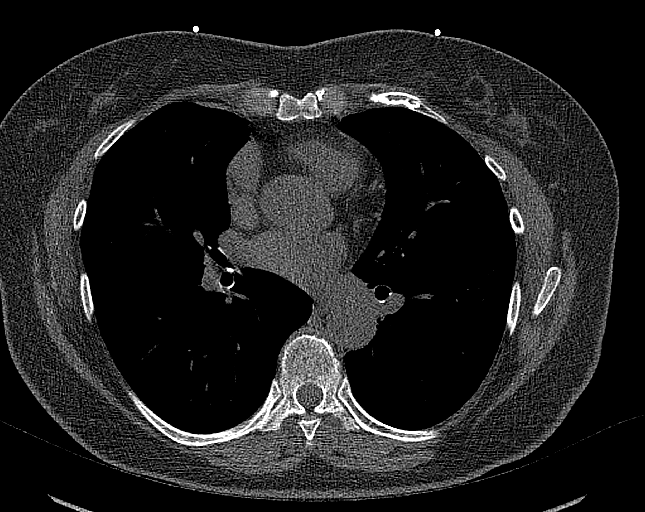
[im 57/69  vessel]
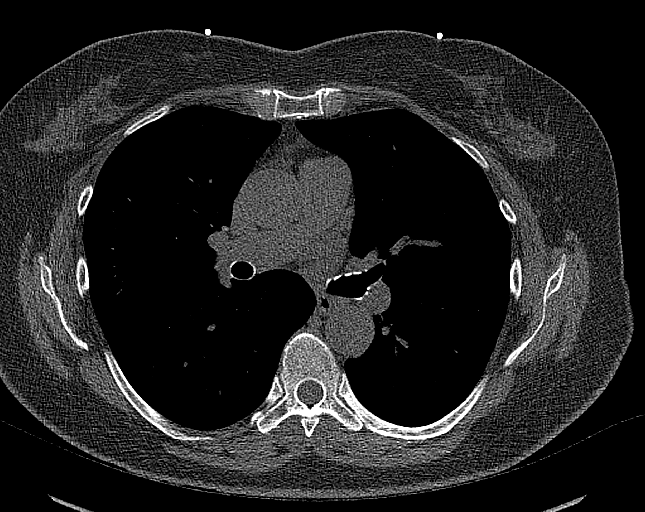

[14 of 20 positions shown; findings below may reference images not displayed]

FINDINGS: Technical quality: Good

CORONARY CALCIUM SCORES:

Left Main: No coronary artery calcification

LAD:

LCx:

RCA: No coronary calcification

CORONARY CALCIUM

Total Agatston Score:

[HOSPITAL] percentile: 60

Ascending aorta (normal <  40 mm): 33 mm

EXTRACARDIAC FINDINGS:

Limited view of the lung parenchyma demonstrates no suspicious
nodularity. Airways are normal.

Limited view of the mediastinum demonstrates no adenopathy.
Esophagus normal.

Limited view of the upper abdomen is unremarkable.

Limited view of the skeleton and chest wall is unremarkable.
IMPRESSION: 1. LAD and LEFT circumflex coronary artery calcification.

2. Total Agatston Score: 16

3. MESA age and sex matched database percentile: 60

## 2021-11-27 ENCOUNTER — Other Ambulatory Visit (HOSPITAL_BASED_OUTPATIENT_CLINIC_OR_DEPARTMENT_OTHER): Payer: Self-pay

## 2021-11-27 MED ORDER — COMIRNATY 30 MCG/0.3ML IM SUSY
PREFILLED_SYRINGE | INTRAMUSCULAR | 0 refills | Status: AC
  Filled 2021-11-27: qty 0.3, 1d supply, fill #0

## 2022-03-20 ENCOUNTER — Encounter: Payer: Self-pay | Admitting: Physician Assistant

## 2022-03-20 ENCOUNTER — Ambulatory Visit (INDEPENDENT_AMBULATORY_CARE_PROVIDER_SITE_OTHER): Payer: HMO | Admitting: Physician Assistant

## 2022-03-20 VITALS — BP 120/81 | HR 97 | Wt 143.0 lb

## 2022-03-20 DIAGNOSIS — G43009 Migraine without aura, not intractable, without status migrainosus: Secondary | ICD-10-CM

## 2022-03-20 MED ORDER — NARATRIPTAN HCL 2.5 MG PO TABS: 2.5000 mg | ORAL_TABLET | ORAL | 6 refills | Status: DC | PRN

## 2022-03-20 NOTE — Patient Instructions (Signed)
Try the Ubrelvy or Nurtec to treat a headache.  If it works, Engineer, manufacturing.  Give [place] a call for a refill.  If it does not work, it is acceptable to take the naratriptan.  There is not an interaction with use of both medications.       Migraine Headache A migraine headache is a very strong throbbing pain on one side or both sides of your head. This type of headache can also cause other symptoms. It can last from 4 hours to 3 days. Talk with your doctor about what things may bring on (trigger) this condition. What are the causes? The exact cause of this condition is not known. This condition may be triggered or caused by: Drinking alcohol. Smoking. Taking medicines, such as: Medicine used to treat chest pain (nitroglycerin). Birth control pills. Estrogen. Some blood pressure medicines. Eating or drinking certain products. Doing physical activity. Other things that may trigger a migraine headache include: Having a menstrual period. Pregnancy. Hunger. Stress. Not getting enough sleep or getting too much sleep. Weather changes. Tiredness (fatigue). What increases the risk? Being 65-41 years old. Being female. Having a family history of migraine headaches. Being Caucasian. Having depression or anxiety. Being very overweight. What are the signs or symptoms? A throbbing pain. This pain may: Happen in any area of the head, such as on one side or both sides. Make it hard to do daily activities. Get worse with physical activity. Get worse around bright lights or loud noises. Other symptoms may include: Feeling sick to your stomach (nauseous). Vomiting. Dizziness. Being sensitive to bright lights, loud noises, or smells. Before you get a migraine headache, you may get warning signs (an aura). An aura may include: Seeing flashing lights or having blind spots. Seeing bright spots, halos, or zigzag lines. Having tunnel vision or blurred vision. Having numbness or a tingling feeling. Having  trouble talking. Having weak muscles. Some people have symptoms after a migraine headache (postdromal phase), such as: Tiredness. Trouble thinking (concentrating). How is this treated? Taking medicines that: Relieve pain. Relieve the feeling of being sick to your stomach. Prevent migraine headaches. Treatment may also include: Having acupuncture. Avoiding foods that bring on migraine headaches. Learning ways to control your body functions (biofeedback). Therapy to help you know and deal with negative thoughts (cognitive behavioral therapy). Follow these instructions at home: Medicines Take over-the-counter and prescription medicines only as told by your doctor. Ask your doctor if the medicine prescribed to you: Requires you to avoid driving or using heavy machinery. Can cause trouble pooping (constipation). You may need to take these steps to prevent or treat trouble pooping: Drink enough fluid to keep your pee (urine) pale yellow. Take over-the-counter or prescription medicines. Eat foods that are high in fiber. These include beans, whole grains, and fresh fruits and vegetables. Limit foods that are high in fat and sugar. These include fried or sweet foods. Lifestyle Do not drink alcohol. Do not use any products that contain nicotine or tobacco, such as cigarettes, e-cigarettes, and chewing tobacco. If you need help quitting, ask your doctor. Get at least 8 hours of sleep every night. Limit and deal with stress. General instructions Keep a journal to find out what may bring on your migraine headaches. For example, write down: What you eat and drink. How much sleep you get. Any change in what you eat or drink. Any change in your medicines. If you have a migraine headache: Avoid things that make your symptoms worse, such as bright  lights. It may help to lie down in a dark, quiet room. Do not drive or use heavy machinery. Ask your doctor what activities are safe for you. Keep  all follow-up visits as told by your doctor. This is important. Contact a doctor if: You get a migraine headache that is different or worse than others you have had. You have more than 15 headache days in one month. Get help right away if: Your migraine headache gets very bad. Your migraine headache lasts longer than 72 hours. You have a fever. You have a stiff neck. You have trouble seeing. Your muscles feel weak or like you cannot control them. You start to lose your balance a lot. You start to have trouble walking. You pass out (faint). You have a seizure. Summary A migraine headache is a very strong throbbing pain on one side or both sides of your head. These headaches can also cause other symptoms. This condition may be treated with medicines and changes to your lifestyle. Keep a journal to find out what may bring on your migraine headaches. Contact a doctor if you get a migraine headache that is different or worse than others you have had. Contact your doctor if you have more than 15 headache days in a month. This information is not intended to replace advice given to you by your health care provider. Make sure you discuss any questions you have with your health care provider. Document Revised: 07/03/2021 Document Reviewed: 03/03/2018 Elsevier Patient Education  [place].

## 2022-03-20 NOTE — Progress Notes (Signed)
CC: Follow up headaches  Doing okay per patient, has had some bad ones lately  History:  Sonia [name] is a 67 y.o. G0P0000 who presents to clinic today for yearly headache followup.  She has had some worse headaches lately.  Trigger 2 days ago may have been eating cinnamon.  Chocolate is also a consistent trigger.   The naratriptan still does work to relieve acute migraine.  She notes it takes quite a bit of time to take action.    HIT6:50 Number of days in the last 4 weeks with:  Severe headache: 0 Moderate headache: 2 Mild headache: 3  No headache: 23   Past Medical History:  Diagnosis Date   Abnormal Pap smear 1990   Allergic rhinitis, cause unspecified    Allergy    SEASONAL   Anemia    Anxiety    DENIES   Asthma    Diabetes mellitus without complication ([place])    on insulin   Fibroid    GERD (gastroesophageal reflux disease)    Hyperlipidemia    Hypertension    DENIES   Polyp of nasal cavity    Skin cancer (melanoma) ([place])    STD (sexually transmitted disease)    HSV2   Unspecified asthma(493.90)     Social History   Socioeconomic History   Marital status: Married    Spouse name: Not on file   Number of children: Not on file   Years of education: Not on file   Highest education level: Not on file  Occupational History    Employer: [employer]  Tobacco Use   Smoking status: Former    Types: Cigarettes   Smokeless tobacco: Never   Tobacco comments:    Did it for a few months  Vaping Use   Vaping Use: Never used  Substance and Sexual Activity   Alcohol use: Yes    Comment: 1 a month   Drug use: No   Sexual activity: Yes    Partners: Male    Birth control/protection: Surgical    Comment: supracervical hysterectomy  Other Topics Concern   Not on file  Social History Narrative   Not on file   Social Determinants of Health   Financial Resource Strain: Not on file  Food Insecurity: No Food Insecurity (01/19/2019)   Hunger Vital Sign     Worried About Running Out of Food in the Last Year: Never true    Ran Out of Food in the Last Year: Never true  Transportation Needs: No Transportation Needs (01/19/2019)   PRAPARE - Hydrologist (Medical): No    Lack of Transportation (Non-Medical): No  Physical Activity: Not on file  Stress: Not on file  Social Connections: Not on file  Intimate Partner Violence: Not on file    Family History  Problem Relation Age of Onset   Cancer Mother        bile duct liver cancer   Thyroid disease Mother        thryoid removed   Cancer Father        leukemia   Asthma Sister        SEVERE CHRONIC   Cancer Maternal Grandmother        stomach that started around rectum   Heart attack Maternal Grandfather    Stroke Paternal Grandmother    Cancer Paternal Grandfather        started in kidneys   Esophageal cancer Neg Hx  Allergies  Allergen Reactions   Ciprofloxacin    Doxycycline     Nausea vomiting   Flagyl [Metronidazole]     GI intolerance   Penicillins    Amoxicillin Swelling and Rash    Current Outpatient Medications on File Prior to Visit  Medication Sig Dispense Refill   acyclovir (ZOVIRAX) 400 MG tablet Take 1 tablet (400 mg total) by mouth 2 (two) times daily. Increase to 2 tablets (824m) three times daily for 5 days with symptoms. 180 tablet 3   augmented betamethasone dipropionate (DIPROLENE-AF) 0AB-123456789% cream 1 application. 2 (two) times daily as needed.     b complex vitamins capsule Take 1 capsule by mouth daily.     Biotin w/ Vitamins C & E (HAIR/SKIN/NAILS PO) Take 2 tablets by mouth daily. 2 gummies a day     Continuous Blood Gluc Receiver (FREESTYLE LIBRE READER) DEVI by Does not apply route. In Left Arm     esomeprazole (NEXIUM) 40 MG capsule 1 capsule twice daily 30 minutes before meals for 2 months then take ac breakfast daily 60 capsule 2   ferrous sulfate 325 (65 FE) MG EC tablet Take 325 mg by mouth once a week.      fexofenadine (ALLEGRA) 180 MG tablet Take 180 mg by mouth daily.     fluticasone (FLONASE) 50 MCG/ACT nasal spray SPRAY TWO SPRAYS IN EACH NOSTRIL ONCE DAILY 48 mL 3   hydrocortisone (ANUSOL-HC) 2.5 % rectal cream Place 1 application rectally 2 (two) times daily. 30 g 1   insulin aspart (NOVOLOG) 100 UNIT/ML FlexPen 3 (three) times a day as needed. Slide in scale     insulin glargine (LANTUS) 100 units/mL SOLN Inject into the skin daily. 20 units     lisinopril (PRINIVIL,ZESTRIL) 5 MG tablet Take 5 mg by mouth daily.  2   metFORMIN (GLUCOPHAGE) 1000 MG tablet Take 1,000 mg by mouth 2 (two) times daily.     montelukast (SINGULAIR) 10 MG tablet Take 1 tablet (10 mg total) by mouth daily. 90 tablet 3   Multiple Vitamin (MULTIVITAMIN) tablet Take 2 tablets by mouth daily. 2 gummies a day     naratriptan (AMERGE) 2.5 MG tablet Take 1 tablet (2.5 mg total) by mouth as needed. Take one (1) tablet at onset of headache; if returns or does not resolve, may repeat after 4 hours; do not exceed five (5) mg in 24 hours. 18 tablet 6   ONETOUCH VERIO test strip 1 each 3 (three) times daily.     simvastatin (ZOCOR) 20 MG tablet Take 20 mg by mouth daily.     Tiotropium Bromide Monohydrate (SPIRIVA RESPIMAT) 2.5 MCG/ACT AERS Inhale 2 puffs daily 4 g 6   Wheat Dextrin (BENEFIBER PO) Take by mouth. As needed     COVID-19 mRNA vaccine 2023-2024 (COMIRNATY) syringe Inject into the muscle. (Patient not taking: Reported on 03/20/2022) 0.3 mL 0   levalbuterol (XOPENEX HFA) 45 MCG/ACT inhaler Inhale 2 puffs into the lungs every 6 (six) hours as needed for wheezing. 15 g 6   sucralfate (CARAFATE) 1 g tablet 1 tablet crushed and dissolved in water to make slurry 4 times daily before meals and at bedtime for 7 days (Patient not taking: Reported on 10/08/2021) 21 tablet 0   No current facility-administered medications on file prior to visit.     Review of Systems:  All pertinent positive/negative included in HPI, all other  review of systems are negative   Objective:  Physical Exam BP  120/81   Pulse 97   Wt 143 lb (64.9 kg)   LMP 01/02/2001 Comment: supracervical hysterectomy  BMI 25.33 kg/m  CONSTITUTIONAL: Well-developed, well-nourished female in no acute distress.  EYES: EOM intact ENT: Normocephalic CARDIOVASCULAR: Regular rate  RESPIRATORY: Normal rate. MUSCULOSKELETAL: Normal ROM SKIN: Warm, dry without erythema  NEUROLOGICAL: Alert, oriented, CN II-XII grossly intact, Appropriate balance PSYCH: Normal behavior, mood   Assessment & Plan:  Assessment: 1. Migraine without aura and without status migrainosus, not intractable      Plan: Samples provided for Nurtec and Ubrelvy.  Pt will try for acute headache.  If effective, she will let us know and I am happy to send in prescription.  These would be safer alternatives to triptan medication considering her age.   Follow-up in 12 months or sooner PRN   Face to face time spent today with patient for this encounter: 45 minutes Lacie Draft 03/20/2022 8:41 AM

## 2022-04-24 DIAGNOSIS — E1165 Type 2 diabetes mellitus with hyperglycemia: Secondary | ICD-10-CM | POA: Diagnosis not present

## 2022-04-24 DIAGNOSIS — E7800 Pure hypercholesterolemia, unspecified: Secondary | ICD-10-CM | POA: Diagnosis not present

## 2022-04-24 DIAGNOSIS — Z794 Long term (current) use of insulin: Secondary | ICD-10-CM | POA: Diagnosis not present

## 2022-04-24 DIAGNOSIS — I10 Essential (primary) hypertension: Secondary | ICD-10-CM | POA: Diagnosis not present

## 2022-04-24 DIAGNOSIS — E113311 Type 2 diabetes mellitus with moderate nonproliferative diabetic retinopathy with macular edema, right eye: Secondary | ICD-10-CM | POA: Diagnosis not present

## 2022-04-24 DIAGNOSIS — D72829 Elevated white blood cell count, unspecified: Secondary | ICD-10-CM | POA: Diagnosis not present

## 2022-06-09 DIAGNOSIS — E7800 Pure hypercholesterolemia, unspecified: Secondary | ICD-10-CM | POA: Diagnosis not present

## 2022-06-09 DIAGNOSIS — E113311 Type 2 diabetes mellitus with moderate nonproliferative diabetic retinopathy with macular edema, right eye: Secondary | ICD-10-CM | POA: Diagnosis not present

## 2022-06-10 LAB — LAB REPORT - SCANNED
A1c: 7
EGFR: 60

## 2022-06-16 DIAGNOSIS — E113311 Type 2 diabetes mellitus with moderate nonproliferative diabetic retinopathy with macular edema, right eye: Secondary | ICD-10-CM | POA: Diagnosis not present

## 2022-06-16 DIAGNOSIS — K219 Gastro-esophageal reflux disease without esophagitis: Secondary | ICD-10-CM | POA: Diagnosis not present

## 2022-06-16 DIAGNOSIS — J309 Allergic rhinitis, unspecified: Secondary | ICD-10-CM | POA: Diagnosis not present

## 2022-06-16 DIAGNOSIS — I10 Essential (primary) hypertension: Secondary | ICD-10-CM | POA: Diagnosis not present

## 2022-06-16 DIAGNOSIS — E7800 Pure hypercholesterolemia, unspecified: Secondary | ICD-10-CM | POA: Diagnosis not present

## 2022-06-16 DIAGNOSIS — E559 Vitamin D deficiency, unspecified: Secondary | ICD-10-CM | POA: Diagnosis not present

## 2022-06-16 DIAGNOSIS — E875 Hyperkalemia: Secondary | ICD-10-CM | POA: Diagnosis not present

## 2022-06-18 ENCOUNTER — Telehealth: Payer: Self-pay | Admitting: Gastroenterology

## 2022-06-18 NOTE — Telephone Encounter (Signed)
Inbound call from patient, she is requesting to speak with a nurse because she said she is having some burning in her esophagus but just in the morning as soon as she wake up . She dont know if she need a different medication or schedule an appt .Please advise

## 2022-06-23 MED ORDER — SUCRALFATE 1 G PO TABS: ORAL_TABLET | ORAL | 0 refills | Status: AC

## 2022-06-23 NOTE — Telephone Encounter (Signed)
Called and spoke with patient. She reports that she has been having intermittent burning in her esophagus. Pt states that she has not had a lot of burning this week. I advised pt on an anti-reflux diet. Patient is still taking Nexium 40 mg daily. Patient states that symptoms were controlled when she was taking Carafate but she does need a refill. I advised pt that we will send in refill until her appt. Patient has been scheduled for a follow up with Quentin Mulling, PA-C on 09/02/22 at 11 am. Patient verbalized understanding and had no concerns at the end of the call.

## 2022-07-10 ENCOUNTER — Other Ambulatory Visit: Payer: Self-pay | Admitting: Internal Medicine

## 2022-07-17 DIAGNOSIS — D509 Iron deficiency anemia, unspecified: Secondary | ICD-10-CM | POA: Diagnosis not present

## 2022-07-17 DIAGNOSIS — E1165 Type 2 diabetes mellitus with hyperglycemia: Secondary | ICD-10-CM | POA: Diagnosis not present

## 2022-07-17 DIAGNOSIS — E7800 Pure hypercholesterolemia, unspecified: Secondary | ICD-10-CM | POA: Diagnosis not present

## 2022-07-18 ENCOUNTER — Other Ambulatory Visit: Payer: Self-pay

## 2022-07-18 ENCOUNTER — Emergency Department (HOSPITAL_BASED_OUTPATIENT_CLINIC_OR_DEPARTMENT_OTHER)
Admission: EM | Admit: 2022-07-18 | Discharge: 2022-07-18 | Disposition: A | Discharge status: Home / Self Care | Payer: PPO | Attending: Emergency Medicine | Admitting: Emergency Medicine

## 2022-07-18 ENCOUNTER — Encounter (HOSPITAL_BASED_OUTPATIENT_CLINIC_OR_DEPARTMENT_OTHER): Payer: Self-pay | Admitting: Emergency Medicine

## 2022-07-18 DIAGNOSIS — Y92007 Garden or yard of unspecified non-institutional (private) residence as the place of occurrence of the external cause: Secondary | ICD-10-CM | POA: Diagnosis not present

## 2022-07-18 DIAGNOSIS — S59911A Unspecified injury of right forearm, initial encounter: Secondary | ICD-10-CM | POA: Diagnosis not present

## 2022-07-18 DIAGNOSIS — Y9389 Activity, other specified: Secondary | ICD-10-CM | POA: Diagnosis not present

## 2022-07-18 DIAGNOSIS — X58XXXA Exposure to other specified factors, initial encounter: Secondary | ICD-10-CM | POA: Insufficient documentation

## 2022-07-18 DIAGNOSIS — W5581XA Bitten by other mammals, initial encounter: Secondary | ICD-10-CM | POA: Diagnosis not present

## 2022-07-18 DIAGNOSIS — R58 Hemorrhage, not elsewhere classified: Secondary | ICD-10-CM | POA: Diagnosis not present

## 2022-07-18 NOTE — ED Notes (Addendum)
Arm measurement above injury 8 inches, below 6.5. No swelling or redness noted.

## 2022-07-18 NOTE — ED Triage Notes (Signed)
Pt was doing yardwork today and noticed blood on her arm.  Pt has a few scratches and two dots that the patient is concerned that a snake bit her.  No bleeding currently, no swelling noted.  Lungs clear, No airway closing.  Pox 100%.

## 2022-07-18 NOTE — ED Provider Notes (Signed)
[facility] Provider Note   CSN: [id number] Arrival date & time: 07/18/22  1014     History  Chief Complaint  Patient presents with   Skin Problem    Sonia [name] is a 67 y.o. female.  HPI   67 year old left handed female presenting to the emerged apartment with concern for possible snakebite.  The patient states that she was doing yard work and working in her garden today when she noticed sudden pain and blood on her right forearm.  She sustained what appeared to be 2 puncture wounds to the mid right forearm.  Bleeding has since stopped.  She denies any pain or swelling.  She called EMS to be evaluated and they recommended she be seen in the emergency department.  Her tetanus is up-to-date.  She denies any other injuries or complaints.  Home Medications Prior to Admission medications   Medication Sig Start Date End Date Taking? Authorizing Provider  acyclovir (ZOVIRAX) 400 MG tablet Take 1 tablet (400 mg total) by mouth 2 (two) times daily. Increase to 2 tablets (800mg ) three times daily for 5 days with symptoms. 07/28/21   Jerene Bears, MD  augmented betamethasone dipropionate (DIPROLENE-AF) 0.05 % cream 1 application. 2 (two) times daily as needed.    [provider]  b complex vitamins capsule Take 1 capsule by mouth daily.    [provider]  Biotin w/ Vitamins C & E (HAIR/SKIN/NAILS PO) Take 2 tablets by mouth daily. 2 gummies a day    [provider]  Continuous Blood Gluc Receiver (FREESTYLE LIBRE READER) DEVI by Does not apply route. In Left Arm    [provider]  COVID-19 mRNA vaccine [phone] (COMIRNATY) syringe Inject into the muscle. Patient not taking: Reported on 03/20/2022 11/27/21   Judyann Munson, MD  esomeprazole (NEXIUM) 40 MG capsule 1 capsule twice daily 30 minutes before meals for 2 months then take ac breakfast daily 08/22/21   Napoleon Form, MD  ferrous sulfate 325  (65 FE) MG EC tablet Take 325 mg by mouth once a week.    [provider]  fexofenadine (ALLEGRA) 180 MG tablet Take 180 mg by mouth daily.    [provider]  fluticasone (FLONASE) 50 MCG/ACT nasal spray SPRAY TWO SPRAYS IN EACH NOSTRIL ONCE DAILY 09/09/20   Jetty Duhamel D, MD  hydrocortisone (ANUSOL-HC) 2.5 % rectal cream Place 1 application rectally 2 (two) times daily. 12/13/19   Napoleon Form, MD  insulin aspart (NOVOLOG) 100 UNIT/ML FlexPen 3 (three) times a day as needed. Slide in scale 07/13/17   [provider]  insulin glargine (LANTUS) 100 units/mL SOLN Inject into the skin daily. 20 units    [provider]  levalbuterol (XOPENEX HFA) 45 MCG/ACT inhaler Inhale 2 puffs into the lungs every 6 (six) hours as needed for wheezing. 12/03/20 10/08/21  Jetty Duhamel D, MD  lisinopril (PRINIVIL,ZESTRIL) 5 MG tablet Take 5 mg by mouth daily. 02/14/17   [provider]  metFORMIN (GLUCOPHAGE) 1000 MG tablet Take 1,000 mg by mouth 2 (two) times daily.    [provider]  montelukast (SINGULAIR) 10 MG tablet TAKE 1 TABLET BY MOUTH DAILY 07/10/22   Jetty Duhamel D, MD  Multiple Vitamin (MULTIVITAMIN) tablet Take 2 tablets by mouth daily. 2 gummies a day    [provider]  naratriptan (AMERGE) 2.5 MG tablet Take 1 tablet (2.5 mg total) by mouth as needed. Take one (1)  tablet at onset of headache; if returns or does not resolve, may repeat after 4 hours; do not exceed five (5) mg in 24 hours. 03/20/22   Teague Chestine Spore, Scot Jun, PA-C  ONETOUCH VERIO test strip 1 each 3 (three) times daily. 09/09/20   [provider]  simvastatin (ZOCOR) 20 MG tablet Take 20 mg by mouth daily.    [provider]  sucralfate (CARAFATE) 1 g tablet 1 tablet crushed and dissolved in water to make slurry 4 times daily before meals and at bedtime. 06/23/22   Napoleon Form, MD  Tiotropium Bromide Monohydrate (SPIRIVA RESPIMAT) 2.5 MCG/ACT AERS  Inhale 2 puffs daily 12/03/20   Jetty Duhamel D, MD  Wheat Dextrin (BENEFIBER PO) Take by mouth. As needed    [provider]      Allergies    Ciprofloxacin, Doxycycline, Flagyl [metronidazole], Penicillins, and Amoxicillin    Review of Systems   Review of Systems  All other systems reviewed and are negative.   Physical Exam Updated Vital Signs BP 133/79 (BP Location: Left Arm)   Pulse 82   Temp 98.6 F (37 C) (Oral)   Resp 20   Ht 5\' 3"  (1.6 m)   Wt 64 kg   LMP 01/02/2001 Comment: supracervical hysterectomy  SpO2 100%   BMI 24.98 kg/m  Physical Exam Vitals and nursing note reviewed.  Constitutional:      General: She is not in acute distress. HENT:     Head: Normocephalic and atraumatic.  Eyes:     Conjunctiva/sclera: Conjunctivae normal.     Pupils: Pupils are equal, round, and reactive to light.  Cardiovascular:     Rate and Rhythm: Normal rate and regular rhythm.  Pulmonary:     Effort: Pulmonary effort is normal. No respiratory distress.  Abdominal:     General: There is no distension.     Tenderness: There is no guarding.  Musculoskeletal:        General: Signs of injury present. No deformity.     Cervical back: Neck supple.     Comments: Two small puncture wounds to the mid right forearm, no tenderness, bleeding, or swelling noted, NVI  Skin:    Findings: No lesion or rash.  Neurological:     General: No focal deficit present.     Mental Status: She is alert. Mental status is at baseline.       ED Results / Procedures / Treatments   Labs (all labs ordered are listed, but only abnormal results are displayed) Labs Reviewed - No data to display  EKG None  Radiology No results found.  Procedures Procedures    Medications Ordered in ED Medications - No data to display  ED Course/ Medical Decision Making/ A&P                             Medical Decision Making   67 year old left handed female presenting to the emerged apartment  with concern for possible snakebite.  The patient states that she was doing yard work and working in her garden today when she noticed sudden pain and blood on her right forearm.  She sustained what appeared to be 2 puncture wounds to the mid right forearm.  Bleeding has since stopped.  She denies any pain or swelling.  She called EMS to be evaluated and they recommended she be seen in the emergency department.  Her tetanus is up-to-date.  She denies any  other injuries or complaints.  On arrival, the patient was vitally stable.  Presenting on exam with 2 puncture wounds consistent with possible snakebite although with apart from the puncture wounds could be slightly wider than would be considered normal for snake bite.  No evidence of envenomation at this time.  If a snake bite, this would likely be a dry bite.  I spoke with [facility] poison control who recommended observation for 6 hours post bite for any evidence of envenomation.  Elevation of the extremity and marking of the extremity was performed.  If negative after 6-hour observation, patient cleared for discharge with no further workup.  The patient was monitored for a total of 6 hours post injury, no evidence of localized reaction, stable for discharge, provided with infectious return precautions.     Final Clinical Impression(s) / ED Diagnoses Final diagnoses:  Injury of right forearm, initial encounter    Rx / DC Orders ED Discharge Orders     None         Ernie Avena, MD 07/18/22 1327

## 2022-07-18 NOTE — Discharge Instructions (Addendum)
You were observed in the Emergency Department for 6 hours post injury with no evidence of localized swelling or envenomation. Poison control was contacted and may call you at home in follow-up. Watch for evidence of developing skin infection which includes worsening pain, redness, swelling, purulent drainage and seek care if you develop these symptoms.

## 2022-07-18 NOTE — Progress Notes (Unsigned)
Patient ID: Sonia [name], female    DOB: 11/04/1955, 67 y.o.   MRN: [id number]  HPI  female never smoker followed for asthma, allergic rhinitis/nasal polyps, complicated by DM 2, GERD, HBP Office spirometry- 06/29/17-  WNL -----------------------------------------------------------------------------------------------------   07/17/21- 65 yoF never smoker followed for Asthma, Allergic Rhinitis/Nasal Polyps, complicated by DM 2, GERD, HBP, CAD,  -Spiriva handihaler, Xopenex hfa, Singulair , Allegra,     Intolerant LABA stimulation Covid vax- 5 Phizer ACT score 24 Has done very well.  No significant exacerbation.  1 or 2 episodes where she incidentally noted minimal wheeze this spring.  Also some increased nasal stuffiness during peak pollen season.  She felt her current meds managed fine.  Asked refill Singulair.  07/20/22- 66 yoF never smoker followed for Asthma, Allergic Rhinitis/Nasal Polyps, complicated by DM 2, GERD, HTN, CAD,  -Spiriva handihaler, Xopenex hfa, Singulair , Allegra,     Intolerant LABA stimulation ED 6/15 for possible snake bite She was watched at ER for possible reaction, and then released home.  She shows 2 small puncture spots on right forearm that may simply be scratches-nonspecific. Asthma control has been good.  Does well with Spiriva.  ROS-see HPI    + = positive Constitutional:   No-   weight loss, night sweats, fevers, chills, fatigue, lassitude. HEENT:   No-  headaches, difficulty swallowing, tooth/dental problems, sore throat,      No-sneezing, itching, ear ache,+ nasal congestion, +post nasal drip,  CV:  No-   chest pain, orthopnea, PND, swelling in lower extremities, anasarca, dizziness, palpitations Resp: No-   shortness of breath with exertion or at rest.              No-   productive cough,  No non-productive cough,  No- coughing up of blood.              No-   change in color of mucus.  No- wheezing.   Skin: No-   rash or lesions. GI:  No-    heartburn, indigestion, abdominal pain, nausea, vomiting,  GU:  MS:  No-   joint pain or swelling.   Neuro-     nothing unusual Psych:  No- change in mood or affect. No depression or anxiety.  No memory loss.   OBJ- Physical Exam General- Alert, Oriented, Affect-appropriate, Distress- none acute Skin- rash-none, lesions- none, excoriation- none Lymphadenopathy- none Head- atraumatic            Eyes- Gross vision intact, PERRLA, conjunctivae and secretions clear            Ears- Hearing, canals-normal            Nose-  + sniffing, no-Septal dev, mucus, polyps, erosion, perforation             Throat- Mallampati III , mucosa clear , drainage- none, tonsils- atrophic Neck- flexible , trachea midline, no stridor , thyroid nl, carotid no bruit Chest - symmetrical excursion , unlabored           Heart/CV- RRR , no murmur , no gallop  , no rub, nl s1 s2                           - JVD- none , edema- none, stasis changes- none, varices- none           Lung- clear to P&A, wheeze+ trace above left scapula, cough- none , dullness-none, rub- none  Chest wall-  Abd-  Br/ Gen/ Rectal- Not done, not indicated Extrem- cyanosis- none, clubbing, none, atrophy- none, strength- nl Neuro- grossly intact to observation

## 2022-07-18 NOTE — ED Notes (Signed)
Arm elevated.  Measurement above injury 8 inches, below 6.5inches. No swelling noted

## 2022-07-18 NOTE — ED Notes (Signed)
No redness, swelling, discoloration or drainage noted.  Pt discharged at this time

## 2022-07-20 ENCOUNTER — Ambulatory Visit (INDEPENDENT_AMBULATORY_CARE_PROVIDER_SITE_OTHER): Payer: PPO | Admitting: Internal Medicine

## 2022-07-20 ENCOUNTER — Encounter: Payer: Self-pay | Admitting: Internal Medicine

## 2022-07-20 VITALS — BP 128/84 | HR 81 | Temp 98.5°F | Ht 63.0 in | Wt 141.8 lb

## 2022-07-20 DIAGNOSIS — J330 Polyp of nasal cavity: Secondary | ICD-10-CM | POA: Diagnosis not present

## 2022-07-20 DIAGNOSIS — J4520 Mild intermittent asthma, uncomplicated: Secondary | ICD-10-CM | POA: Diagnosis not present

## 2022-07-20 NOTE — Assessment & Plan Note (Signed)
Continues mild intermittent uncomplicated. Plan-current medicines are appropriate.  She can call for refills.

## 2022-07-20 NOTE — Assessment & Plan Note (Signed)
Problems.  She is not having enough trouble to warrant Biologic therapy at this time.

## 2022-07-20 NOTE — Patient Instructions (Signed)
Glad you are doing well. Please call if we can help 

## 2022-07-21 ENCOUNTER — Ambulatory Visit (INDEPENDENT_AMBULATORY_CARE_PROVIDER_SITE_OTHER): Payer: PPO | Admitting: Podiatry

## 2022-07-21 ENCOUNTER — Encounter: Payer: Self-pay | Admitting: Podiatry

## 2022-07-21 DIAGNOSIS — J309 Allergic rhinitis, unspecified: Secondary | ICD-10-CM | POA: Diagnosis not present

## 2022-07-21 DIAGNOSIS — E119 Type 2 diabetes mellitus without complications: Secondary | ICD-10-CM

## 2022-07-21 DIAGNOSIS — W5911XD Bitten by nonvenomous snake, subsequent encounter: Secondary | ICD-10-CM | POA: Diagnosis not present

## 2022-07-21 DIAGNOSIS — Z09 Encounter for follow-up examination after completed treatment for conditions other than malignant neoplasm: Secondary | ICD-10-CM | POA: Diagnosis not present

## 2022-07-21 NOTE — Progress Notes (Signed)
Subjective: Sonia [name] is a 67 y.o. female patient with history of diabetes who presents to office today for a diabetic foot exam. Relates overall doing well.   Patient denies any other pedal complaints at this time.  A1c  last was 7 patient reported.   Patient Active Problem List   Diagnosis Date Noted   S/P laparoscopic supracervical hysterectomy 06/03/2021   HSV (herpes simplex virus) anogenital infection 07/18/2020   History of cryosurgery 07/18/2020   Migraine without aura 04/07/2012   AODM 04/27/2008   NASAL POLYP 04/27/2007   Seasonal and perennial allergic rhinitis 04/27/2007   Allergic asthma, mild intermittent, uncomplicated 04/27/2007   Current Outpatient Medications on File Prior to Visit  Medication Sig Dispense Refill   acyclovir (ZOVIRAX) 400 MG tablet Take 1 tablet (400 mg total) by mouth 2 (two) times daily. Increase to 2 tablets (800mg ) three times daily for 5 days with symptoms. 180 tablet 3   augmented betamethasone dipropionate (DIPROLENE-AF) 0.05 % cream 1 application. 2 (two) times daily as needed.     b complex vitamins capsule Take 1 capsule by mouth daily.     Biotin w/ Vitamins C & E (HAIR/SKIN/NAILS PO) Take 2 tablets by mouth daily. 2 gummies a day     Continuous Blood Gluc Receiver (FREESTYLE LIBRE READER) DEVI by Does not apply route. In Left Arm     COVID-19 mRNA vaccine 2023-2024 (COMIRNATY) syringe Inject into the muscle. 0.3 mL 0   esomeprazole (NEXIUM) 40 MG capsule 1 capsule twice daily 30 minutes before meals for 2 months then take ac breakfast daily 60 capsule 2   ferrous sulfate 325 (65 FE) MG EC tablet Take 325 mg by mouth once a week.     fexofenadine (ALLEGRA) 180 MG tablet Take 180 mg by mouth daily.     fluticasone (FLONASE) 50 MCG/ACT nasal spray SPRAY TWO SPRAYS IN EACH NOSTRIL ONCE DAILY 48 mL 3   hydrocortisone (ANUSOL-HC) 2.5 % rectal cream Place 1 application rectally 2 (two) times daily. 30 g 1   insulin aspart (NOVOLOG) 100  UNIT/ML FlexPen 3 (three) times a day as needed. Slide in scale     insulin glargine (LANTUS) 100 units/mL SOLN Inject into the skin daily. 20 units     levalbuterol (XOPENEX HFA) 45 MCG/ACT inhaler Inhale 2 puffs into the lungs every 6 (six) hours as needed for wheezing. 15 g 6   lisinopril (PRINIVIL,ZESTRIL) 5 MG tablet Take 5 mg by mouth daily.  2   metFORMIN (GLUCOPHAGE) 1000 MG tablet Take 1,000 mg by mouth 2 (two) times daily.     montelukast (SINGULAIR) 10 MG tablet TAKE 1 TABLET BY MOUTH DAILY 90 tablet 0   Multiple Vitamin (MULTIVITAMIN) tablet Take 2 tablets by mouth daily. 2 gummies a day     naratriptan (AMERGE) 2.5 MG tablet Take 1 tablet (2.5 mg total) by mouth as needed. Take one (1) tablet at onset of headache; if returns or does not resolve, may repeat after 4 hours; do not exceed five (5) mg in 24 hours. 18 tablet 6   ONETOUCH VERIO test strip 1 each 3 (three) times daily.     simvastatin (ZOCOR) 20 MG tablet Take 20 mg by mouth daily.     sucralfate (CARAFATE) 1 g tablet 1 tablet crushed and dissolved in water to make slurry 4 times daily before meals and at bedtime. 360 tablet 0   Tiotropium Bromide Monohydrate (SPIRIVA RESPIMAT) 2.5 MCG/ACT AERS Inhale 2 puffs daily 4  g 6   Wheat Dextrin (BENEFIBER PO) Take by mouth. As needed     No current facility-administered medications on file prior to visit.   Allergies  Allergen Reactions   Ciprofloxacin    Doxycycline     Nausea vomiting   Flagyl [Metronidazole]     GI intolerance   Penicillins    Amoxicillin Swelling and Rash    Recent Results (from the past 2160 hour(s))  Lab report - scanned     Status: None   Collection Time: 06/10/22  2:07 PM  Result Value Ref Range   EGFR 60.0     Comment: Abstracted by HIM   A1c 7.0     Comment: Abstracted by HIM    Objective: General: Patient is awake, alert, and oriented x 3 and in no acute distress.  Integument: Skin is warm, dry and supple bilateral. Nails are polished  and well manicured. No open lesions or preulcerative lesions present bilateral. Remaining integument unremarkable.  Vasculature:  Dorsalis Pedis pulse 2/4 bilateral. Posterior Tibial pulse  2/4 bilateral.  Capillary fill time <3 sec 1-5 bilateral. Positive hair growth to the level of the digits. Temperature gradient within normal limits. No varicosities present bilateral. No edema present bilateral.   Neurology: The patient has intact sensation measured with a 5.07/10g Semmes Weinstein Monofilament at all pedal sites bilateral . Vibratory sensation intact bilateral with tuning fork. No Babinski sign present bilateral.   Musculoskeletal: No symptomatic pedal deformities noted bilateral. Muscular strength 5/5 in all lower extremity muscular groups bilateral without pain on range of motion . No tenderness with calf compression bilateral.  Assessment and Plan: Problem List Items Addressed This Visit   None Visit Diagnoses     Encounter for diabetic foot exam (HCC)    -  Primary   Diabetes mellitus without complication (HCC)             -Examined patient. -Discussed and educated patient on diabetic foot care, especially with  regards to the vascular, neurological and musculoskeletal systems.  -Stressed the importance of good glycemic control and the detriment of not  controlling glucose levels in relation to the foot. -All patient's nails are well manicured -Patient is a low risk diabetic and should return annually for foot exam advised patient if she has difficulty with her pedicures providing nail care then she may routinely turn to our office for nail care however at this time since patient has a good pedicurist there is no acute needs except for an exam annually unless symptoms change or worsen -Answered all patient questions -Patient to return yearly for diabetic foot exam -Patient advised to call the office if any problems or questions arise in the meantime.  Louann Sjogren, DPM

## 2022-07-28 DIAGNOSIS — R4182 Altered mental status, unspecified: Secondary | ICD-10-CM | POA: Diagnosis not present

## 2022-07-28 DIAGNOSIS — E161 Other hypoglycemia: Secondary | ICD-10-CM | POA: Diagnosis not present

## 2022-07-28 DIAGNOSIS — E162 Hypoglycemia, unspecified: Secondary | ICD-10-CM | POA: Diagnosis not present

## 2022-08-10 DIAGNOSIS — H5021 Vertical strabismus, right eye: Secondary | ICD-10-CM | POA: Diagnosis not present

## 2022-08-10 DIAGNOSIS — H52221 Regular astigmatism, right eye: Secondary | ICD-10-CM | POA: Diagnosis not present

## 2022-08-10 DIAGNOSIS — H2513 Age-related nuclear cataract, bilateral: Secondary | ICD-10-CM | POA: Diagnosis not present

## 2022-08-10 DIAGNOSIS — H5203 Hypermetropia, bilateral: Secondary | ICD-10-CM | POA: Diagnosis not present

## 2022-08-10 DIAGNOSIS — E119 Type 2 diabetes mellitus without complications: Secondary | ICD-10-CM | POA: Diagnosis not present

## 2022-08-10 DIAGNOSIS — H524 Presbyopia: Secondary | ICD-10-CM | POA: Diagnosis not present

## 2022-08-13 DIAGNOSIS — D509 Iron deficiency anemia, unspecified: Secondary | ICD-10-CM | POA: Diagnosis not present

## 2022-08-13 DIAGNOSIS — K219 Gastro-esophageal reflux disease without esophagitis: Secondary | ICD-10-CM | POA: Diagnosis not present

## 2022-08-13 DIAGNOSIS — E1165 Type 2 diabetes mellitus with hyperglycemia: Secondary | ICD-10-CM | POA: Diagnosis not present

## 2022-08-13 DIAGNOSIS — E559 Vitamin D deficiency, unspecified: Secondary | ICD-10-CM | POA: Diagnosis not present

## 2022-08-17 DIAGNOSIS — D2222 Melanocytic nevi of left ear and external auricular canal: Secondary | ICD-10-CM | POA: Diagnosis not present

## 2022-08-17 DIAGNOSIS — D2262 Melanocytic nevi of left upper limb, including shoulder: Secondary | ICD-10-CM | POA: Diagnosis not present

## 2022-08-17 DIAGNOSIS — D225 Melanocytic nevi of trunk: Secondary | ICD-10-CM | POA: Diagnosis not present

## 2022-08-17 DIAGNOSIS — D2271 Melanocytic nevi of right lower limb, including hip: Secondary | ICD-10-CM | POA: Diagnosis not present

## 2022-08-17 DIAGNOSIS — L814 Other melanin hyperpigmentation: Secondary | ICD-10-CM | POA: Diagnosis not present

## 2022-08-17 DIAGNOSIS — L813 Cafe au lait spots: Secondary | ICD-10-CM | POA: Diagnosis not present

## 2022-08-17 DIAGNOSIS — L661 Lichen planopilaris: Secondary | ICD-10-CM | POA: Diagnosis not present

## 2022-08-17 DIAGNOSIS — L821 Other seborrheic keratosis: Secondary | ICD-10-CM | POA: Diagnosis not present

## 2022-08-17 DIAGNOSIS — Z85820 Personal history of malignant melanoma of skin: Secondary | ICD-10-CM | POA: Diagnosis not present

## 2022-08-17 DIAGNOSIS — D1801 Hemangioma of skin and subcutaneous tissue: Secondary | ICD-10-CM | POA: Diagnosis not present

## 2022-08-17 DIAGNOSIS — D2272 Melanocytic nevi of left lower limb, including hip: Secondary | ICD-10-CM | POA: Diagnosis not present

## 2022-08-17 DIAGNOSIS — D2261 Melanocytic nevi of right upper limb, including shoulder: Secondary | ICD-10-CM | POA: Diagnosis not present

## 2022-08-28 ENCOUNTER — Other Ambulatory Visit (HOSPITAL_BASED_OUTPATIENT_CLINIC_OR_DEPARTMENT_OTHER): Payer: Self-pay | Admitting: Obstetrics & Gynecology

## 2022-08-28 DIAGNOSIS — Z1231 Encounter for screening mammogram for malignant neoplasm of breast: Secondary | ICD-10-CM

## 2022-09-02 ENCOUNTER — Ambulatory Visit: Payer: HMO | Admitting: Physician Assistant

## 2022-09-03 ENCOUNTER — Ambulatory Visit (HOSPITAL_BASED_OUTPATIENT_CLINIC_OR_DEPARTMENT_OTHER)
Admission: RE | Admit: 2022-09-03 | Discharge: 2022-09-03 | Disposition: A | Discharge status: Home / Self Care | Payer: PPO | Source: Ambulatory Visit | Attending: Obstetrics & Gynecology | Admitting: Obstetrics & Gynecology

## 2022-09-03 DIAGNOSIS — Z1231 Encounter for screening mammogram for malignant neoplasm of breast: Secondary | ICD-10-CM | POA: Insufficient documentation

## 2022-09-07 ENCOUNTER — Other Ambulatory Visit: Payer: Self-pay | Admitting: Obstetrics & Gynecology

## 2022-09-07 DIAGNOSIS — R928 Other abnormal and inconclusive findings on diagnostic imaging of breast: Secondary | ICD-10-CM

## 2022-09-08 ENCOUNTER — Encounter: Payer: Self-pay | Admitting: Physician Assistant

## 2022-09-08 ENCOUNTER — Ambulatory Visit (INDEPENDENT_AMBULATORY_CARE_PROVIDER_SITE_OTHER): Payer: PPO | Admitting: Physician Assistant

## 2022-09-08 VITALS — BP 138/60 | HR 111 | Ht 63.0 in | Wt 137.8 lb

## 2022-09-08 DIAGNOSIS — Z8719 Personal history of other diseases of the digestive system: Secondary | ICD-10-CM

## 2022-09-08 DIAGNOSIS — Z86010 Personal history of colonic polyps: Secondary | ICD-10-CM | POA: Diagnosis not present

## 2022-09-08 DIAGNOSIS — K219 Gastro-esophageal reflux disease without esophagitis: Secondary | ICD-10-CM

## 2022-09-08 MED ORDER — FAMOTIDINE 40 MG PO TABS: 40.0000 mg | ORAL_TABLET | Freq: Every day | ORAL | 6 refills | Status: DC

## 2022-09-08 NOTE — Patient Instructions (Signed)
_______________________________________________________  If your blood pressure at your visit was 140/90 or greater, please contact your primary care physician to follow up on this.  _______________________________________________________  If you are age 69 or older, your body mass index should be between 23-30. Your Body mass index is 24.41 kg/m. If this is out of the aforementioned range listed, please consider follow up with your Primary Care Provider.  If you are age 51 or younger, your body mass index should be between 19-25. Your Body mass index is 24.41 kg/m. If this is out of the aformentioned range listed, please consider follow up with your Primary Care Provider.   ________________________________________________________  The [facility] providers would like to encourage you to use [facility] to communicate with providers for non-urgent requests or questions.  Due to long hold times on the telephone, sending your provider a message by [facility] may be a faster and more efficient way to get a response.  Please allow 48 business hours for a response.  Please remember that this is for non-urgent requests.  _______________________________________________________  Continue your Nexium 40 mg tablet every morning. Start Pepcid 40 mg by mouth every night after dinner. Try Pepcid for one month. If helpful continue, if not call and speak to the nurse.  It was a pleasure to see you today!  Thank you for trusting me with your gastrointestinal care!

## 2022-09-08 NOTE — Progress Notes (Signed)
Subjective:    Patient ID: Sonia [name], female    DOB: 10/09/1955, 67 y.o.   MRN: [id number]  HPI [name] is a pleasant 67 year old white female, established with Dr. Lavon Paganini last seen in May 2023 who comes in today for follow-up. She had undergone EGD and colonoscopy in July 2023 for evaluation of iron deficiency anemia.  At EGD she was found to have multiple mucosal papules with bleeding at biopsy, and patchy antral gastritis.  Biopsies were taken which showed benign hyperplastic gastric polyps, no H. pylori present. At colonoscopy at that same setting she had 3 polyps removed largest 4 to 5 mm and was noted to have a few diverticuli in the left colon.  Path on the polyps were tubular adenomas and was indicated for 3-year interval follow-up.  She reports today that her PCP through Dr. Carolee Rota office has followed her labs and that the anemia has resolved.  She believes that she had iron studies done there within the past few months as well and is not requiring any iron supplement currently. Her only complaint today while on Nexium 40 mg p.o. every morning is that of a dry feeling in her esophagus which is present early in the mornings.  She says sometimes she gets up and get something to drink because her esophagus feels dry but then usually when she gets back out of bed a couple of hours later she will still have that sensation.  She is taking several vitamins at bedtime, no other medicines that would necessarily contribute though she does take Claritin in the mornings.  She has no complaints of dysphagia or odynophagia, no heartburn symptoms during the daytime not aware of any sour brash at nighttime. She recently tried some Carafate but did not feel that it made much difference so she stopped that after a couple of weeks.  She finds that if she eats regular full meals during the daytime that she tends to have less symptoms of heartburn and really is only experiencing that dry sensation at  nighttime.  No lower GI complaints today, specifically no problems with constipation or diarrhea no abdominal pain.  Other medical problems include migraines, adult onset diabetes mellitus.   Review of Systems Pertinent positive and negative review of systems were noted in the above HPI section.  All other review of systems was otherwise negative.   Outpatient Encounter Medications as of 09/08/2022  Medication Sig   acyclovir (ZOVIRAX) 400 MG tablet Take 1 tablet (400 mg total) by mouth 2 (two) times daily. Increase to 2 tablets (800mg ) three times daily for 5 days with symptoms.   augmented betamethasone dipropionate (DIPROLENE-AF) 0.05 % cream 1 application. 2 (two) times daily as needed.   b complex vitamins capsule Take 1 capsule by mouth daily.   Biotin w/ Vitamins C & E (HAIR/SKIN/NAILS PO) Take 2 tablets by mouth daily. 2 gummies a day   COVID-19 mRNA vaccine 2023-2024 (COMIRNATY) syringe Inject into the muscle.   esomeprazole (NEXIUM) 40 MG capsule 1 capsule twice daily 30 minutes before meals for 2 months then take ac breakfast daily   famotidine (PEPCID) 40 MG tablet Take 1 tablet (40 mg total) by mouth daily.   fexofenadine (ALLEGRA) 180 MG tablet Take 180 mg by mouth daily.   fluticasone (FLONASE) 50 MCG/ACT nasal spray SPRAY TWO SPRAYS IN EACH NOSTRIL ONCE DAILY   hydrocortisone (ANUSOL-HC) 2.5 % rectal cream Place 1 application rectally 2 (two) times daily.   insulin aspart (NOVOLOG) 100 UNIT/ML  FlexPen 3 (three) times a day as needed. Slide in scale   insulin glargine (LANTUS) 100 units/mL SOLN Inject into the skin daily. 20 units   lisinopril (PRINIVIL,ZESTRIL) 5 MG tablet Take 5 mg by mouth daily.   metFORMIN (GLUCOPHAGE) 1000 MG tablet Take 1,000 mg by mouth 2 (two) times daily.   montelukast (SINGULAIR) 10 MG tablet TAKE 1 TABLET BY MOUTH DAILY   Multiple Vitamin (MULTIVITAMIN) tablet Take 2 tablets by mouth daily. 2 gummies a day   naratriptan (AMERGE) 2.5 MG tablet Take  1 tablet (2.5 mg total) by mouth as needed. Take one (1) tablet at onset of headache; if returns or does not resolve, may repeat after 4 hours; do not exceed five (5) mg in 24 hours.   ONETOUCH VERIO test strip 1 each 3 (three) times daily.   Pediatric Multivitamins-Iron (FLINTSTONES PLUS IRON PO) Take 1 tablet by mouth every other day.   simvastatin (ZOCOR) 20 MG tablet Take 20 mg by mouth daily.   sucralfate (CARAFATE) 1 g tablet 1 tablet crushed and dissolved in water to make slurry 4 times daily before meals and at bedtime.   Tiotropium Bromide Monohydrate (SPIRIVA RESPIMAT) 2.5 MCG/ACT AERS Inhale 2 puffs daily   Wheat Dextrin (BENEFIBER PO) Take by mouth. As needed   levalbuterol (XOPENEX HFA) 45 MCG/ACT inhaler Inhale 2 puffs into the lungs every 6 (six) hours as needed for wheezing.   [DISCONTINUED] Continuous Blood Gluc Receiver (FREESTYLE LIBRE READER) DEVI by Does not apply route. In Left Arm   [DISCONTINUED] ferrous sulfate 325 (65 FE) MG EC tablet Take 325 mg by mouth once a week.   No facility-administered encounter medications on file as of 09/08/2022.   Allergies  Allergen Reactions   Ciprofloxacin    Doxycycline     Nausea vomiting   Flagyl [Metronidazole]     GI intolerance   Penicillins    Amoxicillin Swelling and Rash   Patient Active Problem List   Diagnosis Date Noted   S/P laparoscopic supracervical hysterectomy 06/03/2021   HSV (herpes simplex virus) anogenital infection 07/18/2020   History of cryosurgery 07/18/2020   Migraine without aura 04/07/2012   AODM 04/27/2008   NASAL POLYP 04/27/2007   Seasonal and perennial allergic rhinitis 04/27/2007   Allergic asthma, mild intermittent, uncomplicated 04/27/2007   Social History   Socioeconomic History   Marital status: Married    Spouse name: Not on file   Number of children: Not on file   Years of education: Not on file   Highest education level: Not on file  Occupational History    Employer: [employer]  Tobacco Use   Smoking status: Former    Types: Cigarettes   Smokeless tobacco: Never   Tobacco comments:    Did it for a few months  Vaping Use   Vaping status: Never Used  Substance and Sexual Activity   Alcohol use: Yes    Comment: 1 a month   Drug use: No   Sexual activity: Yes    Partners: Male    Birth control/protection: Surgical    Comment: supracervical hysterectomy  Other Topics Concern   Not on file  Social History Narrative   Not on file   Social Determinants of Health   Financial Resource Strain: Not on file  Food Insecurity: No Food Insecurity (01/19/2019)   Hunger Vital Sign    Worried About Running Out of Food in the Last Year: Never true    Ran Out of  Food in the Last Year: Never true  Transportation Needs: No Transportation Needs (01/19/2019)   PRAPARE - Administrator, Civil Service (Medical): No    Lack of Transportation (Non-Medical): No  Physical Activity: Not on file  Stress: Not on file  Social Connections: Unknown (06/15/2021)   Received from [facility]   Social Network    Social Network: Not on file  Intimate Partner Violence: Unknown (05/07/2021)   [facility] from [facility], [facility]   HITS    Physically Hurt: Not on file    Insult or Talk Down To: Not on file    Threaten Physical Harm: Not on file    Scream or Curse: Not on file    Ms. Falter's family history includes Asthma in her sister; Cancer in her father, maternal grandmother, mother, and paternal grandfather; Heart attack in her maternal grandfather; Stroke in her paternal grandmother; Thyroid disease in her mother.      Objective:    Vitals:   09/08/22 1520  BP: 138/60  Pulse: (!) 111    Physical Exam Well-developed well-nourished older white female in no acute distress.  Pleasant height, Weight, 137 BMI 24.4  HEENT; nontraumatic normocephalic, EOMI, PE R LA, sclera anicteric. Oropharynx; no oral thrush Neck; supple, no  JVD Cardiovascular; regular rate and rhythm with S1-S2, no murmur rub or gallop Pulmonary; Clear bilaterally Abdomen; soft, nontender, nondistended, no palpable mass or hepatosplenomegaly, bowel sounds are active Rectal; not done today Skin; benign exam, no jaundice rash or appreciable lesions Extremities; no clubbing cyanosis or edema skin warm and dry Neuro/Psych; alert and oriented x4, grossly nonfocal mood and affect appropriate        Assessment & Plan:   #79 67 year old white female with history of chronic GERD, stable on Nexium 40 mg p.o. every morning, has recently been experiencing early-morning dry irritated sensation in the esophagus, no oral or pharyngeal symptoms, no dysphagia or odynophagia. Not certain whether this is medication related or perhaps manifestation of nocturnal GERD symptoms  #2 history of multiple benign hyperplastic gastric polyps with oozing at the time of EGD July 2023 # 3 history of mild gastritis, H. pylori negative #4 history of adenomatous colon polyps-up-to-date with colonoscopy done July 2023-indicated for 3-year interval follow-up #5 adult onset diabetes mellitus insulin-dependent #6 history of iron deficiency anemia-has been followed by PCP and reports most recent labs with resolution of anemia and not requiring any iron supplementation currently  Plan :continue Nexium 40 mg p.o. every morning AC breakfast Will add a trial of famotidine 40 mg p.o. every afternoon after dinner #30 and 6 refills I have asked her to try this for a month if she has improvement in symptoms can continue if no change in symptoms may discontinue and call for further advice. We also discussed tightening up on her antireflux regimen particularly in the evening with n.p.o. for 3 hours prior to bedtime and considering elevation of the back 45 degrees for sleep.   Rodd Heft Oswald Hillock PA-C 09/08/2022   Cc: Merri Brunette, MD

## 2022-09-16 ENCOUNTER — Ambulatory Visit
Admission: RE | Admit: 2022-09-16 | Discharge: 2022-09-16 | Disposition: A | Discharge status: Home / Self Care | Payer: PPO | Source: Ambulatory Visit | Attending: Obstetrics & Gynecology | Admitting: Obstetrics & Gynecology

## 2022-09-16 ENCOUNTER — Ambulatory Visit: Payer: PPO

## 2022-09-16 DIAGNOSIS — R928 Other abnormal and inconclusive findings on diagnostic imaging of breast: Secondary | ICD-10-CM | POA: Diagnosis not present

## 2022-09-23 DIAGNOSIS — E1165 Type 2 diabetes mellitus with hyperglycemia: Secondary | ICD-10-CM | POA: Diagnosis not present

## 2022-09-23 DIAGNOSIS — I10 Essential (primary) hypertension: Secondary | ICD-10-CM | POA: Diagnosis not present

## 2022-09-23 DIAGNOSIS — D509 Iron deficiency anemia, unspecified: Secondary | ICD-10-CM | POA: Diagnosis not present

## 2022-10-08 ENCOUNTER — Ambulatory Visit: Payer: HMO | Admitting: Internal Medicine

## 2022-10-27 DIAGNOSIS — E7800 Pure hypercholesterolemia, unspecified: Secondary | ICD-10-CM | POA: Diagnosis not present

## 2022-10-27 DIAGNOSIS — E1165 Type 2 diabetes mellitus with hyperglycemia: Secondary | ICD-10-CM | POA: Diagnosis not present

## 2022-10-27 DIAGNOSIS — E113311 Type 2 diabetes mellitus with moderate nonproliferative diabetic retinopathy with macular edema, right eye: Secondary | ICD-10-CM | POA: Diagnosis not present

## 2022-11-06 DIAGNOSIS — Z23 Encounter for immunization: Secondary | ICD-10-CM | POA: Diagnosis not present

## 2022-11-24 DIAGNOSIS — E559 Vitamin D deficiency, unspecified: Secondary | ICD-10-CM | POA: Diagnosis not present

## 2022-11-24 DIAGNOSIS — Z23 Encounter for immunization: Secondary | ICD-10-CM | POA: Diagnosis not present

## 2022-11-24 DIAGNOSIS — E1165 Type 2 diabetes mellitus with hyperglycemia: Secondary | ICD-10-CM | POA: Diagnosis not present

## 2022-11-24 DIAGNOSIS — E7800 Pure hypercholesterolemia, unspecified: Secondary | ICD-10-CM | POA: Diagnosis not present

## 2022-11-24 DIAGNOSIS — I10 Essential (primary) hypertension: Secondary | ICD-10-CM | POA: Diagnosis not present

## 2022-12-08 ENCOUNTER — Encounter: Payer: Self-pay | Admitting: Internal Medicine

## 2022-12-08 ENCOUNTER — Ambulatory Visit: Payer: Self-pay | Admitting: Internal Medicine

## 2022-12-08 ENCOUNTER — Ambulatory Visit: Payer: PPO | Attending: Internal Medicine | Admitting: Internal Medicine

## 2022-12-08 VITALS — BP 108/66 | HR 89 | Ht 63.0 in | Wt 138.4 lb

## 2022-12-08 DIAGNOSIS — Z136 Encounter for screening for cardiovascular disorders: Secondary | ICD-10-CM

## 2022-12-08 DIAGNOSIS — E785 Hyperlipidemia, unspecified: Secondary | ICD-10-CM | POA: Diagnosis not present

## 2022-12-08 DIAGNOSIS — R002 Palpitations: Secondary | ICD-10-CM

## 2022-12-08 DIAGNOSIS — R55 Syncope and collapse: Secondary | ICD-10-CM

## 2022-12-08 DIAGNOSIS — I10 Essential (primary) hypertension: Secondary | ICD-10-CM

## 2022-12-08 NOTE — Progress Notes (Unsigned)
Cardiology Office Note:  .   Date:  12/08/2022  ID:  Sonia [name], DOB May 28, 1955, MRN [id number] PCP: Merri Brunette, MD  [facility] Providers Cardiologist:  Parke Poisson, MD    History of Present Illness: .   Sonia [name] is a 67 y.o. female.  Discussed the use of AI scribe software for clinical note transcription with the patient, who gave verbal consent to proceed.  History of Present Illness   The patient, under the care of an endocrinologist, for diabetes management, presented for a follow-up visit. The patient has been experiencing episodes of hypoglycemia, with one significant episode in June 2024, leading to a loss of consciousness. This episode was managed by EMS, who performed an EKG, which was normal. The patient has since been monitoring her blood sugar levels more frequently and has started using a Dexcom device for continuous glucose monitoring.  The patient also reported occasional palpitations, which have been present for several years. Previous heart monitoring for these palpitations did not reveal any significant abnormalities. However, the patient has not had an echocardiogram to date.  In addition to diabetes, the patient has a history of asthma and is under the care of Dr. Maple Hudson. The patient is on Spiriva, Xopenex, Singulair, and Allegra for asthma and allergy management.  The patient also has a history of gastroesophageal reflux disease (GERD) and is under the care of a gastroenterologist. The patient had a colonoscopy last year and is due for a follow-up in three years. The patient is currently on Nexium and famotidine for GERD management.  The patient also reported a recent incident of a dry snake bite, which did not result in any significant symptoms or complications.  The patient's cholesterol levels are well-managed on simvastatin, and her blood pressure is within normal limits.   In summary, the patient's primary concern is the management  of her diabetes and the prevention of hypoglycemic episodes. The patient is also dealing with asthma, GERD, and occasional palpitations. The patient's medications appear to be effectively managing these conditions. The patient is due for an echocardiogram in the coming year to further evaluate the heart in the context of the reported palpitations and hypoglycemic episodes.        ROS: negative except per HPI above.  Studies Reviewed: Marland Kitchen   EKG Interpretation Date/Time:  Tuesday December 08 2022 08:22:56 EST Ventricular Rate:  79 PR Interval:  126 QRS Duration:  76 QT Interval:  350 QTC Calculation: 401 R Axis:   32  Text Interpretation: Normal sinus rhythm Low voltage QRS Confirmed by Weston Brass ([id number]) on 12/08/2022 8:55:51 AM    Results   DIAGNOSTIC EKG: Normal (07/28/2022) EKG: Normal (12/07/2022) Colonoscopy: Normal (2023)     Risk Assessment/Calculations:             Physical Exam:   VS:  BP 108/66 (BP Location: Left Arm, Patient Position: Sitting, Cuff Size: Normal)   Pulse 89   Ht 5\' 3"  (1.6 m)   Wt 138 lb 6.4 oz (62.8 kg)   LMP 01/02/2001 Comment: supracervical hysterectomy  SpO2 96%   BMI 24.52 kg/m    Wt Readings from Last 3 Encounters:  12/08/22 138 lb 6.4 oz (62.8 kg)  09/08/22 137 lb 12.8 oz (62.5 kg)  07/20/22 141 lb 12.8 oz (64.3 kg)     Physical Exam   VITALS: BP low normal CHEST: Lungs clear to auscultation, no wheezing CARDIOVASCULAR: Heart sounds normal, no murmurs detected  GEN: Well nourished, well developed in no acute distress NECK: No JVD; No carotid bruits CARDIAC: RRR, no murmurs, rubs, gallops RESPIRATORY:  Clear to auscultation without rales, wheezing or rhonchi  ABDOMEN: Soft, non-tender, non-distended EXTREMITIES:  No edema; No deformity   ASSESSMENT AND PLAN: .    1. Syncope and collapse   2. Hypertension, unspecified type   3. Hyperlipidemia, unspecified hyperlipidemia type     Assessment and Plan    Diabetes  [place] Managed by endocrinologist. Patient has a history of hypoglycemic episodes leading to loss of consciousness. Currently using a Dexcom device for glucose monitoring. -Continue current management per endocrine.    Asthma Managed by Dr. Maple Hudson. Patient on Spiriva, Xopenex, Singulair, and Allegra. -Continue current management under Dr. Maple Hudson.  Hyperlipidemia Well controlled on Simvastatin 20mg . -Continue Simvastatin 20mg .  Palpitations Syncope Patient reports occasional palpitations. No associated chest pain. Previous heart monitors showed only palpitations, no atrial fibrillation. Normal EKG today. -Schedule echocardiogram in January/February 2025 to establish a baseline for heart function in setting of syncope. -If palpitations worsen or patient experiences fainting, consider heart monitor to rule out atrial fibrillation and malignant arrhythmia.                Total time of encounter: 30 minutes total time of encounter, including 20 minutes spent in face-to-face patient care on the date of this encounter. This time includes coordination of care and counseling regarding above mentioned problem list. Remainder of non-face-to-face time involved reviewing chart documents/testing relevant to the patient encounter and documentation in the medical record. I have independently reviewed documentation from referring provider.   Weston Brass, MD, [facility]  [facility]

## 2022-12-08 NOTE — Patient Instructions (Signed)
Medication Instructions:  Your physician recommends that you continue on your current medications as directed. Please refer to the Current Medication list given to you today.  *If you need a refill on your cardiac medications before your next appointment, please call your pharmacy*  Lab Work: None  Testing/Procedures: Your physician has requested that you have an echocardiogram in January 2025. Echocardiography is a painless test that uses sound waves to create images of your heart. It provides your doctor with information about the size and shape of your heart and how well your heart's chambers and valves are working. This procedure takes approximately one hour. There are no restrictions for this procedure. Please do NOT wear cologne, perfume, aftershave, or lotions (deodorant is allowed). Please arrive 15 minutes prior to your appointment time. This will take place at [address] [place]    Follow-Up: At [facility], you and your health needs are our priority.  As part of our continuing mission to provide you with exceptional heart care, we have created designated Provider Care Teams.  These Care Teams include your primary Cardiologist (physician) and Advanced Practice Providers (APPs -  Physician Assistants and Nurse Practitioners) who all work together to provide you with the care you need, when you need it.   Your next appointment:   12 month(s)  Provider:   Parke Poisson, MD

## 2022-12-16 DIAGNOSIS — E7800 Pure hypercholesterolemia, unspecified: Secondary | ICD-10-CM | POA: Diagnosis not present

## 2022-12-16 DIAGNOSIS — E113311 Type 2 diabetes mellitus with moderate nonproliferative diabetic retinopathy with macular edema, right eye: Secondary | ICD-10-CM | POA: Diagnosis not present

## 2022-12-23 DIAGNOSIS — I2510 Atherosclerotic heart disease of native coronary artery without angina pectoris: Secondary | ICD-10-CM | POA: Diagnosis not present

## 2022-12-23 DIAGNOSIS — K219 Gastro-esophageal reflux disease without esophagitis: Secondary | ICD-10-CM | POA: Diagnosis not present

## 2022-12-23 DIAGNOSIS — E559 Vitamin D deficiency, unspecified: Secondary | ICD-10-CM | POA: Diagnosis not present

## 2022-12-23 DIAGNOSIS — T148XXA Other injury of unspecified body region, initial encounter: Secondary | ICD-10-CM | POA: Diagnosis not present

## 2022-12-23 DIAGNOSIS — E1165 Type 2 diabetes mellitus with hyperglycemia: Secondary | ICD-10-CM | POA: Diagnosis not present

## 2022-12-23 DIAGNOSIS — I10 Essential (primary) hypertension: Secondary | ICD-10-CM | POA: Diagnosis not present

## 2022-12-23 DIAGNOSIS — Z0000 Encounter for general adult medical examination without abnormal findings: Secondary | ICD-10-CM | POA: Diagnosis not present

## 2023-02-08 ENCOUNTER — Ambulatory Visit (HOSPITAL_COMMUNITY): Payer: PPO

## 2023-02-10 DIAGNOSIS — L813 Cafe au lait spots: Secondary | ICD-10-CM | POA: Diagnosis not present

## 2023-02-10 DIAGNOSIS — L814 Other melanin hyperpigmentation: Secondary | ICD-10-CM | POA: Diagnosis not present

## 2023-02-10 DIAGNOSIS — D2271 Melanocytic nevi of right lower limb, including hip: Secondary | ICD-10-CM | POA: Diagnosis not present

## 2023-02-10 DIAGNOSIS — D2222 Melanocytic nevi of left ear and external auricular canal: Secondary | ICD-10-CM | POA: Diagnosis not present

## 2023-02-10 DIAGNOSIS — D225 Melanocytic nevi of trunk: Secondary | ICD-10-CM | POA: Diagnosis not present

## 2023-02-10 DIAGNOSIS — D2272 Melanocytic nevi of left lower limb, including hip: Secondary | ICD-10-CM | POA: Diagnosis not present

## 2023-02-10 DIAGNOSIS — D1801 Hemangioma of skin and subcutaneous tissue: Secondary | ICD-10-CM | POA: Diagnosis not present

## 2023-02-10 DIAGNOSIS — L821 Other seborrheic keratosis: Secondary | ICD-10-CM | POA: Diagnosis not present

## 2023-02-10 DIAGNOSIS — Z85820 Personal history of malignant melanoma of skin: Secondary | ICD-10-CM | POA: Diagnosis not present

## 2023-02-10 DIAGNOSIS — L918 Other hypertrophic disorders of the skin: Secondary | ICD-10-CM | POA: Diagnosis not present

## 2023-02-24 ENCOUNTER — Other Ambulatory Visit (HOSPITAL_COMMUNITY): Payer: PPO

## 2023-03-08 ENCOUNTER — Ambulatory Visit (HOSPITAL_COMMUNITY): Payer: PPO | Attending: Internal Medicine

## 2023-03-08 DIAGNOSIS — R55 Syncope and collapse: Secondary | ICD-10-CM

## 2023-03-08 DIAGNOSIS — I10 Essential (primary) hypertension: Secondary | ICD-10-CM | POA: Diagnosis not present

## 2023-03-08 DIAGNOSIS — R002 Palpitations: Secondary | ICD-10-CM

## 2023-03-08 LAB — ECHOCARDIOGRAM COMPLETE
Area-P 1/2: 2.78 cm2
S' Lateral: 2.5 cm

## 2023-03-12 ENCOUNTER — Encounter: Payer: Self-pay | Admitting: Physician Assistant

## 2023-03-12 ENCOUNTER — Ambulatory Visit: Payer: PPO | Admitting: Physician Assistant

## 2023-03-12 VITALS — BP 122/69 | HR 96

## 2023-03-12 DIAGNOSIS — G43009 Migraine without aura, not intractable, without status migrainosus: Secondary | ICD-10-CM | POA: Diagnosis not present

## 2023-03-12 NOTE — Progress Notes (Signed)
 Follow up headaches    May needing refills of headache medication   History:  Sonia [name] is a 68 y.o. G0P0000 who presents to clinic today for annual headache visit.  She has had issues with her blood sugar and a snake bite since last seen.  She said she continues to not have any cardiovascular issues.   Holland made her feel the same way imitrex did.  She felt like something was flushing around in her head.  She will not use it.  She checks carefully and states holland is the only in its class that insurance will cover.  She likes naratriptan  and notes no ill effects from it.  Also it eliminates migraine every time.  She has HA currently.   HIT6: Number of days in the last 4 weeks with:  Severe headache: 2 Moderate headache: 4 Mild headache: 2  No headache: 20   Past Medical History:  Diagnosis Date   Abnormal Pap smear 1990   Allergic rhinitis, cause unspecified    Allergy    SEASONAL   Anemia    Anxiety    DENIES   Asthma    Diabetes mellitus without complication (HCC)    on insulin   Fibroid    GERD (gastroesophageal reflux disease)    Hyperlipidemia    Hypertension    DENIES   Polyp of nasal cavity    Skin cancer (melanoma) (HCC)    STD (sexually transmitted disease)    HSV2   Unspecified asthma(493.90)     Social History   Socioeconomic History   Marital status: Married    Spouse name: Not on file   Number of children: Not on file   Years of education: Not on file   Highest education level: Not on file  Occupational History    Employer: [employer]  Tobacco Use   Smoking status: Former    Types: Cigarettes   Smokeless tobacco: Never   Tobacco comments:    Did it for a few months  Vaping Use   Vaping status: Never Used  Substance and Sexual Activity   Alcohol use: Yes    Comment: 1 a month   Drug use: No   Sexual activity: Yes    Partners: Male    Birth control/protection: Surgical    Comment: supracervical hysterectomy  Other  Topics Concern   Not on file  Social History Narrative   Not on file   Social Drivers of Health   Financial Resource Strain: Not on file  Food Insecurity: No Food Insecurity (01/19/2019)   Hunger Vital Sign    Worried About Running Out of Food in the Last Year: Never true    Ran Out of Food in the Last Year: Never true  Transportation Needs: No Transportation Needs (01/19/2019)   PRAPARE - Administrator, Civil Service (Medical): No    Lack of Transportation (Non-Medical): No  Physical Activity: Not on file  Stress: Not on file  Social Connections: Unknown (06/15/2021)   Received from [facility]   Social Network    Social Network: Not on file  Intimate Partner Violence: Unknown (05/07/2021)   Received from [facility]   HITS    Physically Hurt: Not on file    Insult or Talk Down To: Not on file    Threaten Physical Harm: Not on file    Scream or Curse: Not on file    Family History  Problem Relation Age  of Onset   Cancer Mother        bile duct liver cancer   Thyroid  disease Mother        thryoid removed   Cancer Father        leukemia   Asthma Sister        SEVERE CHRONIC   Cancer Maternal Grandmother        stomach that started around rectum   Heart attack Maternal Grandfather    Stroke Paternal Grandmother    Cancer Paternal Grandfather        started in kidneys   Esophageal cancer Neg Hx     Allergies  Allergen Reactions   Ciprofloxacin    Doxycycline     Nausea vomiting   Flagyl [Metronidazole]     GI intolerance   Penicillins    Amoxicillin Swelling and Rash    Current Outpatient Medications on File Prior to Visit  Medication Sig Dispense Refill   acyclovir  (ZOVIRAX ) 400 MG tablet Take 1 tablet (400 mg total) by mouth 2 (two) times daily. Increase to 2 tablets (800mg ) three times daily for 5 days with symptoms. 180 tablet 3   augmented betamethasone dipropionate (DIPROLENE-AF) 0.05 % cream 1 application.  2 (two) times daily as needed.     b complex vitamins capsule Take 1 capsule by mouth daily.     Biotin w/ Vitamins C & E (HAIR/SKIN/NAILS PO) Take 2 tablets by mouth daily. 2 gummies a day     COVID-19 mRNA vaccine 2023-2024 (COMIRNATY ) syringe Inject into the muscle. 0.3 mL 0   esomeprazole  (NEXIUM ) 40 MG capsule 1 capsule twice daily 30 minutes before meals for 2 months then take ac breakfast daily 60 capsule 2   famotidine  (PEPCID ) 40 MG tablet Take 1 tablet (40 mg total) by mouth daily. 30 tablet 6   fexofenadine (ALLEGRA) 180 MG tablet Take 180 mg by mouth daily.     fluticasone  (FLONASE ) 50 MCG/ACT nasal spray SPRAY TWO SPRAYS IN EACH NOSTRIL ONCE DAILY 48 mL 3   hydrocortisone  (ANUSOL -HC) 2.5 % rectal cream Place 1 application rectally 2 (two) times daily. 30 g 1   insulin aspart (NOVOLOG) 100 UNIT/ML FlexPen 3 (three) times a day as needed. Slide in scale     insulin glargine (LANTUS) 100 units/mL SOLN Inject into the skin daily. 20 units     levalbuterol  (XOPENEX  HFA) 45 MCG/ACT inhaler Inhale 2 puffs into the lungs every 6 (six) hours as needed for wheezing. 15 g 6   lisinopril (PRINIVIL,ZESTRIL) 5 MG tablet Take 5 mg by mouth daily.  2   metFORMIN (GLUCOPHAGE) 1000 MG tablet Take 1,000 mg by mouth 2 (two) times daily.     montelukast  (SINGULAIR ) 10 MG tablet TAKE 1 TABLET BY MOUTH DAILY 90 tablet 0   Multiple Vitamin (MULTIVITAMIN) tablet Take 2 tablets by mouth daily. 2 gummies a day     naratriptan  (AMERGE) 2.5 MG tablet Take 1 tablet (2.5 mg total) by mouth as needed. Take one (1) tablet at onset of headache; if returns or does not resolve, may repeat after 4 hours; do not exceed five (5) mg in 24 hours. 18 tablet 6   ONETOUCH VERIO test strip 1 each 3 (three) times daily.     Pediatric Multivitamins-Iron (FLINTSTONES PLUS IRON PO) Take 1 tablet by mouth every other day.     simvastatin (ZOCOR) 20 MG tablet Take 20 mg by mouth daily.     sucralfate  (CARAFATE ) 1 g tablet 1  tablet  crushed and dissolved in water to make slurry 4 times daily before meals and at bedtime. (Patient not taking: Reported on 12/08/2022) 360 tablet 0   Tiotropium Bromide  Monohydrate (SPIRIVA  RESPIMAT) 2.5 MCG/ACT AERS Inhale 2 puffs daily 4 g 6   Wheat Dextrin (BENEFIBER PO) Take by mouth. As needed     No current facility-administered medications on file prior to visit.     Review of Systems:  All pertinent positive/negative included in HPI, all other review of systems are negative   Objective:  Physical Exam BP 122/69   Pulse 96   LMP 01/02/2001 Comment: supracervical hysterectomy CONSTITUTIONAL: Well-developed, well-nourished female in no acute distress.  EYES: EOM intact ENT: Normocephalic CARDIOVASCULAR: Regular rate  RESPIRATORY: Normal rate.  MUSCULOSKELETAL: Normal ROM SKIN: Warm, dry without erythema  NEUROLOGICAL: Alert, oriented, CN II-XII grossly intact, Appropriate balance   PSYCH: Normal behavior, mood   Assessment & Plan:  Assessment: 1. Migraine without aura and without status migrainosus, not intractable      Plan: Pt given nurtec sample to take in office during visit. She notes it did seem to be working and no ill effects noted prior to completion of visit.   She has samples to take home and will call to let us  know if we should pursue prescription/approval of this medication.   Zavzpret samples also provided and I am happy to send rx for this should she prefer it.  She did note that imitrex NS was far superior in her experience.   We will see what we can get covered by insurance as she lets us  know what is most effective.   Follow-up in 12 months or sooner PRN  46 minutes spent face to face with patient this encounter.   Nance Gretta Darice FORBES, PA-C 03/12/2023 11:00 AM

## 2023-03-16 ENCOUNTER — Encounter: Payer: Self-pay | Admitting: *Deleted

## 2023-03-16 ENCOUNTER — Telehealth: Payer: Self-pay | Admitting: Internal Medicine

## 2023-03-16 NOTE — Telephone Encounter (Signed)
Patient is calling to get her results of echo

## 2023-03-16 NOTE — Telephone Encounter (Signed)
Called and made patient aware that per Dr. Jacques Navy normal ECHO. Patient verbalized an understanding.

## 2023-03-16 NOTE — Telephone Encounter (Signed)
Called and spoke to patient. Patient verbalizes she had ECHO and wanted to know when the result would be available. Echo done 2/3. Made patient aware that someone should be reaching out to her with those results. Patient verbalized an understanding.

## 2023-03-19 ENCOUNTER — Encounter: Payer: PPO | Admitting: Physician Assistant

## 2023-05-19 DIAGNOSIS — E7800 Pure hypercholesterolemia, unspecified: Secondary | ICD-10-CM | POA: Diagnosis not present

## 2023-05-19 DIAGNOSIS — E559 Vitamin D deficiency, unspecified: Secondary | ICD-10-CM | POA: Diagnosis not present

## 2023-05-19 DIAGNOSIS — E1165 Type 2 diabetes mellitus with hyperglycemia: Secondary | ICD-10-CM | POA: Diagnosis not present

## 2023-05-25 DIAGNOSIS — E559 Vitamin D deficiency, unspecified: Secondary | ICD-10-CM | POA: Diagnosis not present

## 2023-05-25 DIAGNOSIS — E7800 Pure hypercholesterolemia, unspecified: Secondary | ICD-10-CM | POA: Diagnosis not present

## 2023-05-25 DIAGNOSIS — D509 Iron deficiency anemia, unspecified: Secondary | ICD-10-CM | POA: Diagnosis not present

## 2023-05-25 DIAGNOSIS — E1165 Type 2 diabetes mellitus with hyperglycemia: Secondary | ICD-10-CM | POA: Diagnosis not present

## 2023-05-25 DIAGNOSIS — I10 Essential (primary) hypertension: Secondary | ICD-10-CM | POA: Diagnosis not present

## 2023-06-10 ENCOUNTER — Other Ambulatory Visit (HOSPITAL_BASED_OUTPATIENT_CLINIC_OR_DEPARTMENT_OTHER): Payer: Self-pay | Admitting: Obstetrics & Gynecology

## 2023-06-10 ENCOUNTER — Ambulatory Visit (HOSPITAL_BASED_OUTPATIENT_CLINIC_OR_DEPARTMENT_OTHER): Payer: PPO | Admitting: Obstetrics & Gynecology

## 2023-06-10 ENCOUNTER — Other Ambulatory Visit (HOSPITAL_COMMUNITY)
Admission: RE | Admit: 2023-06-10 | Discharge: 2023-06-10 | Disposition: A | Discharge status: Home / Self Care | Source: Ambulatory Visit | Attending: Obstetrics & Gynecology | Admitting: Obstetrics & Gynecology

## 2023-06-10 ENCOUNTER — Encounter (HOSPITAL_BASED_OUTPATIENT_CLINIC_OR_DEPARTMENT_OTHER): Payer: Self-pay | Admitting: Obstetrics & Gynecology

## 2023-06-10 VITALS — BP 119/68 | HR 90 | Ht 63.0 in | Wt 139.4 lb

## 2023-06-10 DIAGNOSIS — M85851 Other specified disorders of bone density and structure, right thigh: Secondary | ICD-10-CM | POA: Diagnosis not present

## 2023-06-10 DIAGNOSIS — Z9189 Other specified personal risk factors, not elsewhere classified: Secondary | ICD-10-CM | POA: Diagnosis not present

## 2023-06-10 DIAGNOSIS — Z01419 Encounter for gynecological examination (general) (routine) without abnormal findings: Secondary | ICD-10-CM

## 2023-06-10 DIAGNOSIS — Z90711 Acquired absence of uterus with remaining cervical stump: Secondary | ICD-10-CM

## 2023-06-10 DIAGNOSIS — A609 Anogenital herpesviral infection, unspecified: Secondary | ICD-10-CM | POA: Diagnosis not present

## 2023-06-10 DIAGNOSIS — Z124 Encounter for screening for malignant neoplasm of cervix: Secondary | ICD-10-CM | POA: Insufficient documentation

## 2023-06-10 DIAGNOSIS — M85852 Other specified disorders of bone density and structure, left thigh: Secondary | ICD-10-CM | POA: Diagnosis not present

## 2023-06-10 DIAGNOSIS — Z1231 Encounter for screening mammogram for malignant neoplasm of breast: Secondary | ICD-10-CM

## 2023-06-10 MED ORDER — ACYCLOVIR 400 MG PO TABS: 400.0000 mg | ORAL_TABLET | Freq: Two times a day (BID) | ORAL | 3 refills | Status: AC

## 2023-06-10 NOTE — Progress Notes (Signed)
 Breast and Pelvic Patient name: Sonia [name] MRN [id number]  Date of birth: [birth year] Chief Complaint:   Breast and Pelvic  History of Present Illness:   Sonia [name] is a 68 y.o. G0P0000 Caucasian female being seen today for a breast and pelvic exam.  Denies vaginal bleeding.  H/o HSV, oral, and needs RF for acyclovir .    Endocrinologist:  Dr. Ronelle Coffee.  Last hba1c was 6.7.  Using dexcom.     Patient's last menstrual period was 01/02/2001.   Last pap 06/03/2021. Results were: WNL. H/O abnormal pap: no Last mammogram: 09/03/2022 . Results were: normal. Family h/o breast cancer: no Last colonoscopy: 08/21/2021 . Results were: abnormal polyps were noted.  Follow up 3 years.  .     06/10/2023    8:53 AM 07/28/2021    9:52 AM 06/03/2021    1:36 PM 01/19/2019    2:58 PM  Depression screen PHQ 2/9  Decreased Interest 0 0 0 0  Down, Depressed, Hopeless 0 0 0 0  PHQ - 2 Score 0 0 0 0     Review of Systems:   Pertinent items are noted in HPI  Denies any urinary or bowel issues.   Pertinent History Reviewed:  Reviewed past medical,surgical, social and family history.  Reviewed problem list, medications and allergies. Physical Assessment:   Vitals:   06/10/23 0851  BP: 119/68  Pulse: 90  Weight: 139 lb 6.4 oz (63.2 kg)  Height: 5\' 3"  (1.6 m)  Body mass index is 24.69 kg/m.        Physical Examination:   General appearance - well appearing, and in no distress  Mental status - alert, oriented to person, place, and time  Psych:  She has a normal mood and affect  Skin - warm and dry, normal color, no suspicious lesions noted  Chest - effort normal, all lung fields clear to auscultation bilaterally  Heart - normal rate and regular rhythm  Neck:  midline trachea, no thyromegaly or nodules  Breasts - breasts appear normal, no suspicious masses, no skin or nipple changes or  axillary nodes  Abdomen - soft, nontender, nondistended, no masses or organomegaly  Pelvic - VULVA:  normal appearing vulva with no masses, tenderness or lesions    VAGINA: normal appearing vagina with normal color and discharge, no lesions    CERVIX: normal appearing cervix without discharge or lesions, no CMT  Thin prep pap is done today  UTERUS: uterus is felt to be normal size, shape, consistency and nontender   ADNEXA: No adnexal masses or tenderness noted.  Rectal - normal rectal, good sphincter tone, no masses felt.   Extremities:  No swelling or varicosities noted  Chaperone present for exam  Assessment & Plan:  1. GYN exam for high-risk Medicare patient (Primary) - Pap smear updated today - Mammogram 09/2022 - Colonoscopy 2023.  Follow up 3 years - Bone mineral density 07/2020.  Ordered to do with MMG this year. - lab work done with PCP, Dr. Schuyler Custard and with endocrinologist, Dr. Ronelle Coffee - vaccines reviewed/updated  2. HSV (herpes simplex virus) anogenital infection - acyclovir  (ZOVIRAX ) 400 MG tablet; Take 1 tablet (400 mg total) by mouth 2 (two) times daily. Increase to 2 tablets (800mg ) three times daily for 5 days with symptoms.  Dispense: 180 tablet; Refill: 3  3. Encounter for screening mammogram for malignant neoplasm of breast - MM 3D SCREENING MAMMOGRAM BILATERAL BREAST; Future  4. Cervical cancer screening - Cytology - PAP( [facility]) - PR OBTAINING SCREEN PAP SMEAR  5. S/P laparoscopic supracervical hysterectomy    Orders Placed This Encounter  Procedures   MM 3D SCREENING MAMMOGRAM BILATERAL BREAST   DG BONE DENSITY (DXA)   PR OBTAINING SCREEN PAP SMEAR    Meds:  Meds ordered this encounter  Medications   acyclovir  (ZOVIRAX ) 400 MG tablet    Sig: Take 1 tablet (400 mg total) by mouth 2 (two) times daily. Increase to 2 tablets (800mg ) three times daily for 5 days with symptoms.    Dispense:  180 tablet    Refill:  3    Follow-up: Return in about 1 year (around 06/09/2024).  Lillian Rein, MD 06/10/2023 9:26 AM

## 2023-06-14 ENCOUNTER — Encounter (HOSPITAL_BASED_OUTPATIENT_CLINIC_OR_DEPARTMENT_OTHER): Payer: Self-pay | Admitting: Obstetrics & Gynecology

## 2023-06-14 LAB — CYTOLOGY - PAP: Diagnosis: NEGATIVE

## 2023-06-23 ENCOUNTER — Other Ambulatory Visit: Payer: Self-pay | Admitting: Physician Assistant

## 2023-07-18 NOTE — Progress Notes (Unsigned)
 Patient ID: Sonia [name], female    DOB: 1955-10-23, 68 y.o.   MRN: [id number]  HPI  female never smoker followed for asthma, allergic rhinitis/nasal polyps, complicated by DM 2, GERD, HBP Office spirometry- 06/29/17-  WNL -----------------------------------------------------------------------------------------------------  07/20/22- 66 yoF never smoker followed for Asthma, Allergic Rhinitis/Nasal Polyps, complicated by DM 2, GERD, HTN, CAD,  -Spiriva  handihaler, Xopenex  hfa, Singulair  , Allegra,     Intolerant LABA stimulation ED 6/15 for possible snake bite She was watched at ER for possible reaction, and then released home.  She shows 2 small puncture spots on right forearm that may simply be scratches-nonspecific. Asthma control has been good.  Does well with Spiriva .  07/19/23- 67 yoF never smoker followed for Asthma, Allergic Rhinitis/Nasal Polyps, complicated by DM 2, GERD, HTN, CAD,  -Spiriva  handihaler, Xopenex  hfa, Singulair  , Allegra,     Intolerant LABA stimulation Discussed the use of AI scribe software for clinical note transcription with the patient, who gave verbal consent to proceed.  History of Present Illness   Sonia [name] is a 68 year old female with asthma who presents for routine follow-up.  Her asthma is well-controlled with no significant flare-ups over the past year. She experienced a minor episode of wheezing last night while watching TV, which was not severe. Current medications include Spiriva  and Xopenex  inhalers, and Singulair  tablets, which effectively manage her symptoms. She requires refills for these medications.   Otherwise doing very well.     Assessment and Plan:    Asthma  moderate persistent Asthma well-controlled with current medications. Mild wheezing noted recently, but clear today on exam., no acute exacerbations. Spiriva  Respimat preferred for ease of use. - Refill Spiriva  Respimat. - Refill Xopenex  rescue inhaler. - Refill Singulair .      ROS-see HPI    + = positive Constitutional:   No-   weight loss, night sweats, fevers, chills, fatigue, lassitude. HEENT:   No-  headaches, difficulty swallowing, tooth/dental problems, sore throat,      No-sneezing, itching, ear ache,+ nasal congestion, +post nasal drip,  CV:  No-   chest pain, orthopnea, PND, swelling in lower extremities, anasarca, dizziness, palpitations Resp: No-   shortness of breath with exertion or at rest.              No-   productive cough,  No non-productive cough,  No- coughing up of blood.              No-   change in color of mucus.  No- wheezing.   Skin: No-   rash or lesions. GI:  No-   heartburn, indigestion, abdominal pain, nausea, vomiting,  GU:  MS:  No-   joint pain or swelling.   Neuro-     nothing unusual Psych:  No- change in mood or affect. No depression or anxiety.  No memory loss.   OBJ- Physical Exam General- Alert, Oriented, Affect-appropriate, Distress- none acute Skin- rash-none, lesions- none, excoriation- none Lymphadenopathy- none Head- atraumatic            Eyes- Gross vision intact, PERRLA, conjunctivae and secretions clear            Ears- Hearing, canals-normal            Nose-   no-Septal dev, mucus, polyps, erosion, perforation             Throat- Mallampati III , mucosa clear , drainage- none, tonsils- atrophic Neck- flexible , trachea midline, no stridor ,  thyroid  nl, carotid no bruit Chest - symmetrical excursion , unlabored           Heart/CV- RRR , no murmur , no gallop  , no rub, nl s1 s2                           - JVD- none , edema- none, stasis changes- none, varices- none           Lung- clear to P&A, wheeze-none, cough- none , dullness-none, rub- none           Chest wall-  Abd-  Br/ Gen/ Rectal- Not done, not indicated Extrem- cyanosis- none, clubbing, none, atrophy- none, strength- nl Neuro- grossly intact to observation

## 2023-07-20 ENCOUNTER — Ambulatory Visit (INDEPENDENT_AMBULATORY_CARE_PROVIDER_SITE_OTHER): Payer: PPO | Admitting: Internal Medicine

## 2023-07-20 ENCOUNTER — Encounter: Payer: Self-pay | Admitting: Internal Medicine

## 2023-07-20 VITALS — BP 134/83 | HR 94 | Temp 98.6°F | Resp 18 | Ht 63.0 in | Wt 139.0 lb

## 2023-07-20 DIAGNOSIS — J4520 Mild intermittent asthma, uncomplicated: Secondary | ICD-10-CM | POA: Diagnosis not present

## 2023-07-20 MED ORDER — MONTELUKAST SODIUM 10 MG PO TABS: 10.0000 mg | ORAL_TABLET | Freq: Every day | ORAL | 3 refills | Status: AC

## 2023-07-20 MED ORDER — SPIRIVA RESPIMAT 2.5 MCG/ACT IN AERS: INHALATION_SPRAY | RESPIRATORY_TRACT | 12 refills | Status: AC

## 2023-07-20 MED ORDER — LEVALBUTEROL TARTRATE 45 MCG/ACT IN AERO: 2.0000 | INHALATION_SPRAY | Freq: Four times a day (QID) | RESPIRATORY_TRACT | 12 refills | Status: AC | PRN

## 2023-07-20 NOTE — Patient Instructions (Signed)
Refills sent  Please call if we can help 

## 2023-07-21 ENCOUNTER — Encounter: Payer: Self-pay | Admitting: Podiatry

## 2023-07-21 ENCOUNTER — Ambulatory Visit (INDEPENDENT_AMBULATORY_CARE_PROVIDER_SITE_OTHER): Payer: PPO | Admitting: Podiatry

## 2023-07-21 DIAGNOSIS — E119 Type 2 diabetes mellitus without complications: Secondary | ICD-10-CM | POA: Diagnosis not present

## 2023-07-21 NOTE — Progress Notes (Signed)
 Subjective: Sonia [name] is a 68 y.o. female patient with history of diabetes who presents to office today for a diabetic foot exam. Relates overall doing well.   Patient denies any other pedal complaints at this time.  A1c  last was 6.5 patient reported.   Patient Active Problem List   Diagnosis Date Noted   S/P laparoscopic supracervical hysterectomy 06/03/2021   HSV (herpes simplex virus) anogenital infection 07/18/2020   History of cryosurgery 07/18/2020   Migraine without aura 04/07/2012   AODM 04/27/2008   NASAL POLYP 04/27/2007   Seasonal and perennial allergic rhinitis 04/27/2007   Allergic asthma, mild intermittent, uncomplicated 04/27/2007   Current Outpatient Medications on File Prior to Visit  Medication Sig Dispense Refill   acyclovir  (ZOVIRAX ) 400 MG tablet Take 1 tablet (400 mg total) by mouth 2 (two) times daily. Increase to 2 tablets (800mg ) three times daily for 5 days with symptoms. 180 tablet 3   augmented betamethasone dipropionate (DIPROLENE-AF) 0.05 % cream 1 application. 2 (two) times daily as needed.     b complex vitamins capsule Take 1 capsule by mouth daily.     Biotin w/ Vitamins C & E (HAIR/SKIN/NAILS PO) Take 2 tablets by mouth daily. 2 gummies a day     COVID-19 mRNA vaccine 2023-2024 (COMIRNATY ) syringe Inject into the muscle. 0.3 mL 0   esomeprazole  (NEXIUM ) 40 MG capsule 1 capsule twice daily 30 minutes before meals for 2 months then take ac breakfast daily 60 capsule 2   famotidine  (PEPCID ) 40 MG tablet Take 1 tablet (40 mg total) by mouth daily. Please schedule a yearly follow up for further refills. Thank you 30 tablet 1   fexofenadine (ALLEGRA) 180 MG tablet Take 180 mg by mouth daily.     fluticasone  (FLONASE ) 50 MCG/ACT nasal spray SPRAY TWO SPRAYS IN EACH NOSTRIL ONCE DAILY 48 mL 3   hydrocortisone  (ANUSOL -HC) 2.5 % rectal cream Place 1 application rectally 2 (two) times daily. 30 g 1   insulin aspart (NOVOLOG) 100 UNIT/ML FlexPen 3 (three)  times a day as needed. Slide in scale     insulin glargine (LANTUS) 100 units/mL SOLN Inject into the skin daily. 20 units     levalbuterol  (XOPENEX  HFA) 45 MCG/ACT inhaler Inhale 2 puffs into the lungs every 6 (six) hours as needed for wheezing. 15 g 12   lisinopril (PRINIVIL,ZESTRIL) 5 MG tablet Take 5 mg by mouth daily.  2   metFORMIN (GLUCOPHAGE) 1000 MG tablet Take 1,000 mg by mouth 2 (two) times daily.     montelukast  (SINGULAIR ) 10 MG tablet Take 1 tablet (10 mg total) by mouth daily. 90 tablet 3   Multiple Vitamin (MULTIVITAMIN) tablet Take 2 tablets by mouth daily. 2 gummies a day     naratriptan  (AMERGE) 2.5 MG tablet Take 1 tablet (2.5 mg total) by mouth as needed. Take one (1) tablet at onset of headache; if returns or does not resolve, may repeat after 4 hours; do not exceed five (5) mg in 24 hours. 18 tablet 6   ONETOUCH VERIO test strip 1 each 3 (three) times daily.     Pediatric Multivitamins-Iron (FLINTSTONES PLUS IRON PO) Take 1 tablet by mouth every other day.     simvastatin (ZOCOR) 20 MG tablet Take 20 mg by mouth daily.     sucralfate  (CARAFATE ) 1 g tablet 1 tablet crushed and dissolved in water to make slurry 4 times daily before meals and at bedtime. 360 tablet 0   Tiotropium Bromide   Monohydrate (SPIRIVA  RESPIMAT) 2.5 MCG/ACT AERS Inhale 2 puffs daily 4 g 12   Wheat Dextrin (BENEFIBER PO) Take by mouth. As needed     No current facility-administered medications on file prior to visit.   Allergies  Allergen Reactions   Ciprofloxacin    Doxycycline     Nausea vomiting   Flagyl [Metronidazole]     GI intolerance   Penicillins    Amoxicillin Swelling and Rash    Recent Results (from the past 2160 hours)  Cytology - PAP( )     Status: None   Collection Time: 06/10/23  9:24 AM  Result Value Ref Range   Adequacy      Satisfactory for evaluation. The presence or absence of an   Adequacy      endocervical/transformation zone component cannot be determined  because   Adequacy of atrophy.    Diagnosis      - Negative for intraepithelial lesion or malignancy (NILM)    Objective: General: Patient is awake, alert, and oriented x 3 and in no acute distress.  Integument: Skin is warm, dry and supple bilateral. Nails are polished and well manicured. No open lesions or preulcerative lesions present bilateral. Remaining integument unremarkable.  Vasculature:  Dorsalis Pedis pulse 2/4 bilateral. Posterior Tibial pulse  2/4 bilateral.  Capillary fill time <3 sec 1-5 bilateral. Positive hair growth to the level of the digits. Temperature gradient within normal limits. No varicosities present bilateral. No edema present bilateral.   Neurology: The patient has intact sensation measured with a 5.07/10g Semmes Weinstein Monofilament at all pedal sites bilateral . Vibratory sensation intact bilateral with tuning fork. No Babinski sign present bilateral.   Musculoskeletal: No symptomatic pedal deformities noted bilateral. Muscular strength 5/5 in all lower extremity muscular groups bilateral without pain on range of motion . No tenderness with calf compression bilateral.  Assessment and Plan: Problem List Items Addressed This Visit   None Visit Diagnoses       Diabetes mellitus without complication (HCC)    -  Primary     Encounter for diabetic foot exam (HCC)              -Examined patient. -Discussed and educated patient on diabetic foot care, especially with  regards to the vascular, neurological and musculoskeletal systems.  -Stressed the importance of good glycemic control and the detriment of not  controlling glucose levels in relation to the foot. -All patient's nails are well manicured -Patient is a low risk diabetic and should return annually for foot exam advised patient if she has difficulty with her pedicures providing nail care then she may routinely turn to our office for nail care however at this time since patient has a good  pedicurist there is no acute needs except for an exam annually unless symptoms change or worsen -Answered all patient questions -Patient to return yearly for diabetic foot exam -Patient advised to call the office if any problems or questions arise in the meantime.  Jennefer Moats, DPM

## 2023-08-18 ENCOUNTER — Other Ambulatory Visit: Payer: Self-pay | Admitting: Physician Assistant

## 2023-08-18 MED ORDER — NARATRIPTAN HCL 2.5 MG PO TABS: 2.5000 mg | ORAL_TABLET | ORAL | 6 refills | Status: AC | PRN

## 2023-08-18 NOTE — Progress Notes (Signed)
 Pt requested amerge refill - 2 packs due to improved cost.   Will do.  KTeagueClark, PA-C

## 2023-08-25 ENCOUNTER — Ambulatory Visit

## 2023-08-25 DIAGNOSIS — M8589 Other specified disorders of bone density and structure, multiple sites: Secondary | ICD-10-CM | POA: Diagnosis not present

## 2023-08-25 DIAGNOSIS — M85852 Other specified disorders of bone density and structure, left thigh: Secondary | ICD-10-CM

## 2023-08-25 DIAGNOSIS — M85851 Other specified disorders of bone density and structure, right thigh: Secondary | ICD-10-CM | POA: Diagnosis not present

## 2023-08-25 DIAGNOSIS — Z780 Asymptomatic menopausal state: Secondary | ICD-10-CM | POA: Diagnosis not present

## 2023-09-06 ENCOUNTER — Ambulatory Visit
Admission: RE | Admit: 2023-09-06 | Discharge: 2023-09-06 | Disposition: A | Discharge status: Home / Self Care | Source: Ambulatory Visit | Attending: Obstetrics & Gynecology | Admitting: Obstetrics & Gynecology

## 2023-09-06 DIAGNOSIS — Z1231 Encounter for screening mammogram for malignant neoplasm of breast: Secondary | ICD-10-CM | POA: Diagnosis not present

## 2023-09-08 ENCOUNTER — Telehealth (HOSPITAL_BASED_OUTPATIENT_CLINIC_OR_DEPARTMENT_OTHER): Payer: Self-pay | Admitting: Obstetrics & Gynecology

## 2023-09-08 ENCOUNTER — Ambulatory Visit (HOSPITAL_BASED_OUTPATIENT_CLINIC_OR_DEPARTMENT_OTHER): Payer: Self-pay | Admitting: Obstetrics & Gynecology

## 2023-09-08 DIAGNOSIS — Z9189 Other specified personal risk factors, not elsewhere classified: Secondary | ICD-10-CM

## 2023-09-08 DIAGNOSIS — M85851 Other specified disorders of bone density and structure, right thigh: Secondary | ICD-10-CM

## 2023-09-08 NOTE — Telephone Encounter (Signed)
 Called pt after she called with questions about her bone density.  Message was sent through mychart but she admits she doesn't use Mychart much so didn't see it.  She still has osteopenia but does have increased fracture risk of hip fracture with FRAX score of 3.3% which does make her high risk for fracture.  Discussed increasing calcium into to try and get 1200mg  calcium and Vit D into to 1000 - 2000 international units of Vit D.  She is exercising by walking at the mall with friends.  30 minutes at least three times weekly discussed.  Referral to osteoporosis clinic has been made.  She has taken fosamax in the past.  Couldn't remember to take it.  Open to discussing options.  Will call to make appointment.  Questions answered.

## 2023-11-24 DIAGNOSIS — E7800 Pure hypercholesterolemia, unspecified: Secondary | ICD-10-CM | POA: Diagnosis not present

## 2023-11-24 DIAGNOSIS — E1165 Type 2 diabetes mellitus with hyperglycemia: Secondary | ICD-10-CM | POA: Diagnosis not present

## 2023-11-24 LAB — LAB REPORT - SCANNED
A1c: 6.7
Albumin, Urine POC: 3.7
Albumin/Creatinine Ratio, Urine, POC: 10
Creatinine, POC: 36.6 mg/dL
EGFR: 79

## 2023-12-01 DIAGNOSIS — I10 Essential (primary) hypertension: Secondary | ICD-10-CM | POA: Diagnosis not present

## 2023-12-01 DIAGNOSIS — E7800 Pure hypercholesterolemia, unspecified: Secondary | ICD-10-CM | POA: Diagnosis not present

## 2023-12-01 DIAGNOSIS — Z23 Encounter for immunization: Secondary | ICD-10-CM | POA: Diagnosis not present

## 2023-12-01 DIAGNOSIS — E1165 Type 2 diabetes mellitus with hyperglycemia: Secondary | ICD-10-CM | POA: Diagnosis not present

## 2023-12-17 ENCOUNTER — Encounter: Payer: Self-pay | Admitting: *Deleted

## 2023-12-20 ENCOUNTER — Ambulatory Visit: Attending: Internal Medicine | Admitting: Internal Medicine

## 2023-12-20 VITALS — BP 138/62 | HR 79 | Ht 63.0 in | Wt 138.3 lb

## 2023-12-20 DIAGNOSIS — E785 Hyperlipidemia, unspecified: Secondary | ICD-10-CM | POA: Diagnosis not present

## 2023-12-20 DIAGNOSIS — R002 Palpitations: Secondary | ICD-10-CM | POA: Diagnosis not present

## 2023-12-20 DIAGNOSIS — R55 Syncope and collapse: Secondary | ICD-10-CM

## 2023-12-20 DIAGNOSIS — I10 Essential (primary) hypertension: Secondary | ICD-10-CM

## 2023-12-20 NOTE — Patient Instructions (Signed)
 Medication Instructions:  No Changes *If you need a refill on your cardiac medications before your next appointment, please call your pharmacy*  Lab Work:  None  Follow-Up: At [facility], you and your health needs are our priority.  As part of our continuing mission to provide you with exceptional heart care, our providers are all part of one team.  This team includes your primary Cardiologist (physician) and Advanced Practice Providers or APPs (Physician Assistants and Nurse Practitioners) who all work together to provide you with the care you need, when you need it.  Your next appointment:   1 year(s)  Provider:   Euell Herrlich, MD   We recommend signing up for the patient portal called MyChart.  Sign up information is provided on this After Visit Summary.  MyChart is used to connect with patients for Virtual Visits (Telemedicine).  Patients are able to view lab/test results, encounter notes, upcoming appointments, etc.  Non-urgent messages can be sent to your provider as well.   To learn more about what you can do with MyChart, go to [email or web address].   Other Instructions Please call us  or send a MyChart message with any Cardiology related questions/concerns.  [phone].  Thank you!

## 2023-12-20 NOTE — Progress Notes (Signed)
 Cardiology Office Note:  .   Date:  12/20/2023  ID:  Sonia [name], DOB August 10, 1955, MRN [id number] PCP: Clarice Nottingham, MD  [facility] Providers Cardiologist:  Soyla DELENA Merck, MD    History of Present Illness: .   Sonia [name] is a 68 y.o. female.  Discussed the use of AI scribe software for clinical note transcription with the patient, who gave verbal consent to proceed.  History of Present Illness Sonia [name] is a 68 year old female who presents for a cardiovascular follow-up.  She experiences very infrequent palpitations and has not had any recent syncope. A previous heart monitor did not show atrial fibrillation. She has a family history of heart problems on her mother's side but is unaware of any family members with atrial fibrillation.  She experienced chest pain last week after physical activities such as cleaning and lifting, which resolved after a couple of days of rest.  She feels this is likely musculoskeletal.  She walks at least once a week with friends at the mall without chest pain for long periods of time.  Her blood pressure is managed with lisinopril 5 mg daily. She has a home blood pressure machine but does not regularly monitor her blood pressure at home.  Her diabetes is well-managed with an A1c of 6.7%. Cholesterol is controlled with simvastatin 20 mg daily, with recent labs showing an LDL of 79 mg/dL and triglycerides of 54 mg/dL.    ROS: negative except per HPI above.  Studies Reviewed: SABRA   EKG Interpretation Date/Time:  Monday December 20 2023 08:13:21 EST Ventricular Rate:  79 PR Interval:  134 QRS Duration:  78 QT Interval:  360 QTC Calculation: 412 R Axis:   26  Text Interpretation: Normal sinus rhythm Normal ECG When compared with ECG of 08-Dec-2022 08:22, No significant change was found Confirmed by Merck Soyla ([id number]) on 12/20/2023 8:23:37 AM    Results LABS A1c: 6.7 (12/06/2023) LDL: 79  (11/24/2023) Triglycerides: 54 (11/24/2023)  RADIOLOGY Calcium score: 15.6, 60th percentile for age and race (2022)  DIAGNOSTIC EKG: Normal (12/20/2023) Echocardiogram: Normal cardiac function, normal myocardial function, normal valvular function, normal right heart function (03/2023) Risk Assessment/Calculations:       Physical Exam:   VS:  BP 138/62 (BP Location: Right Arm, Patient Position: Sitting, Cuff Size: Normal)   Pulse 79   Ht 5' 3 (1.6 m)   Wt 138 lb 4.8 oz (62.7 kg)   LMP 01/02/2001 Comment: supracervical hysterectomy  SpO2 97%   BMI 24.50 kg/m    Wt Readings from Last 3 Encounters:  12/20/23 138 lb 4.8 oz (62.7 kg)  07/20/23 139 lb (63 kg)  06/10/23 139 lb 6.4 oz (63.2 kg)     Physical Exam VITALS: BP- 138/62 GENERAL: Alert, cooperative, well developed, no acute distress. HEENT: Normocephalic, normal oropharynx, moist mucous membranes. CHEST: Clear to auscultation bilaterally, no wheezes, rhonchi, or crackles. CARDIOVASCULAR: Normal heart rate and rhythm, S1 and S2 normal without murmurs, one skipped beat noted. ABDOMEN: Soft, non-tender, non-distended, without organomegaly, normal bowel sounds. EXTREMITIES: No cyanosis or edema. NEUROLOGICAL: Cranial nerves grossly intact, moves all extremities without gross motor or sensory deficit.   ASSESSMENT AND PLAN: .    Assessment and Plan Assessment & Plan Essential hypertension Blood pressure controlled at 138/62 mmHg. EKG normal, echocardiogram normal. - Continue lisinopril 5 mg daily. - Monitor blood pressure at home twice a week.  Hyperlipidemia Cholesterol controlled with simvastatin. LDL 79 mg/dL, triglycerides 54 mg/dL. Calcium  score 15.6 in 2022, 60th percentile for age and race. - Continue simvastatin 20 mg daily.  Syncope No recurrence, echo normal.  Palpitations Rare, normal EKG  Soyla Merck, MD, FACC

## 2023-12-27 DIAGNOSIS — I10 Essential (primary) hypertension: Secondary | ICD-10-CM | POA: Diagnosis not present

## 2023-12-27 DIAGNOSIS — K219 Gastro-esophageal reflux disease without esophagitis: Secondary | ICD-10-CM | POA: Diagnosis not present

## 2023-12-27 DIAGNOSIS — Z23 Encounter for immunization: Secondary | ICD-10-CM | POA: Diagnosis not present

## 2023-12-27 DIAGNOSIS — E1165 Type 2 diabetes mellitus with hyperglycemia: Secondary | ICD-10-CM | POA: Diagnosis not present

## 2023-12-27 DIAGNOSIS — Z0000 Encounter for general adult medical examination without abnormal findings: Secondary | ICD-10-CM | POA: Diagnosis not present

## 2023-12-27 DIAGNOSIS — E7800 Pure hypercholesterolemia, unspecified: Secondary | ICD-10-CM | POA: Diagnosis not present

## 2023-12-27 DIAGNOSIS — E559 Vitamin D deficiency, unspecified: Secondary | ICD-10-CM | POA: Diagnosis not present

## 2023-12-27 DIAGNOSIS — I2510 Atherosclerotic heart disease of native coronary artery without angina pectoris: Secondary | ICD-10-CM | POA: Diagnosis not present

## 2023-12-29 DIAGNOSIS — E7800 Pure hypercholesterolemia, unspecified: Secondary | ICD-10-CM | POA: Diagnosis not present

## 2023-12-29 DIAGNOSIS — E1165 Type 2 diabetes mellitus with hyperglycemia: Secondary | ICD-10-CM | POA: Diagnosis not present

## 2023-12-29 DIAGNOSIS — I10 Essential (primary) hypertension: Secondary | ICD-10-CM | POA: Diagnosis not present

## 2024-01-12 ENCOUNTER — Ambulatory Visit: Payer: Self-pay | Admitting: Internal Medicine

## 2024-02-28 ENCOUNTER — Other Ambulatory Visit

## 2024-02-28 ENCOUNTER — Other Ambulatory Visit (HOSPITAL_BASED_OUTPATIENT_CLINIC_OR_DEPARTMENT_OTHER)

## 2024-06-12 ENCOUNTER — Ambulatory Visit (HOSPITAL_BASED_OUTPATIENT_CLINIC_OR_DEPARTMENT_OTHER): Admitting: Obstetrics & Gynecology

## 2024-07-26 ENCOUNTER — Ambulatory Visit: Admitting: Podiatry
